# Patient Record
Sex: Female | Born: 1975
Health system: Southern US, Community
[De-identification: ages and names within clinical notes are randomized; demographics above are authoritative.]

## PROBLEM LIST (undated history)

## (undated) DIAGNOSIS — F419 Anxiety disorder, unspecified: Secondary | ICD-10-CM

## (undated) DIAGNOSIS — F32A Depression, unspecified: Secondary | ICD-10-CM

## (undated) DIAGNOSIS — R51 Headache: Secondary | ICD-10-CM

## (undated) DIAGNOSIS — I509 Heart failure, unspecified: Secondary | ICD-10-CM

## (undated) DIAGNOSIS — E039 Hypothyroidism, unspecified: Secondary | ICD-10-CM

## (undated) DIAGNOSIS — B009 Herpesviral infection, unspecified: Secondary | ICD-10-CM

## (undated) DIAGNOSIS — I471 Supraventricular tachycardia, unspecified: Secondary | ICD-10-CM

## (undated) DIAGNOSIS — R519 Headache, unspecified: Secondary | ICD-10-CM

## (undated) DIAGNOSIS — Z950 Presence of cardiac pacemaker: Secondary | ICD-10-CM

## (undated) DIAGNOSIS — F329 Major depressive disorder, single episode, unspecified: Secondary | ICD-10-CM

## (undated) HISTORY — DX: Supraventricular tachycardia, unspecified: I47.10

## (undated) HISTORY — DX: Presence of cardiac pacemaker: Z95.0

## (undated) HISTORY — PX: PACEMAKER INSERTION: SHX728

## (undated) HISTORY — PX: CARDIAC ELECTROPHYSIOLOGY MAPPING AND ABLATION: SHX1292

## (undated) HISTORY — DX: Herpesviral infection, unspecified: B00.9

## (undated) HISTORY — DX: Supraventricular tachycardia: I47.1

## (undated) HISTORY — PX: REDUCTION MAMMAPLASTY: SUR839

## (undated) HISTORY — DX: Hypothyroidism, unspecified: E03.9

---

## 1998-03-03 ENCOUNTER — Inpatient Hospital Stay (HOSPITAL_COMMUNITY): Admission: EM | Admit: 1998-03-03 | Discharge: 1998-03-04 | Payer: Self-pay | Admitting: Emergency Medicine

## 1998-05-02 ENCOUNTER — Emergency Department (HOSPITAL_COMMUNITY): Admission: EM | Admit: 1998-05-02 | Discharge: 1998-05-03 | Payer: Self-pay | Admitting: Emergency Medicine

## 1998-08-06 ENCOUNTER — Inpatient Hospital Stay (HOSPITAL_COMMUNITY): Admission: EM | Admit: 1998-08-06 | Discharge: 1998-08-07 | Payer: Self-pay | Admitting: *Deleted

## 1998-08-06 ENCOUNTER — Encounter: Payer: Self-pay | Admitting: Emergency Medicine

## 1999-02-16 ENCOUNTER — Other Ambulatory Visit: Admission: RE | Admit: 1999-02-16 | Discharge: 1999-02-16 | Payer: Self-pay | Admitting: *Deleted

## 1999-03-31 ENCOUNTER — Inpatient Hospital Stay (HOSPITAL_COMMUNITY): Admission: EM | Admit: 1999-03-31 | Discharge: 1999-04-01 | Payer: Self-pay | Admitting: Emergency Medicine

## 1999-03-31 ENCOUNTER — Encounter: Payer: Self-pay | Admitting: Emergency Medicine

## 2000-05-24 HISTORY — PX: BREAST SURGERY: SHX581

## 2000-06-02 ENCOUNTER — Ambulatory Visit (HOSPITAL_COMMUNITY): Admission: RE | Admit: 2000-06-02 | Discharge: 2000-06-03 | Payer: Self-pay | Admitting: Specialist

## 2000-07-10 ENCOUNTER — Emergency Department (HOSPITAL_COMMUNITY): Admission: EM | Admit: 2000-07-10 | Discharge: 2000-07-10 | Payer: Self-pay | Admitting: Emergency Medicine

## 2000-07-10 ENCOUNTER — Encounter: Payer: Self-pay | Admitting: Emergency Medicine

## 2000-10-04 ENCOUNTER — Other Ambulatory Visit: Admission: RE | Admit: 2000-10-04 | Discharge: 2000-10-04 | Payer: Self-pay | Admitting: Gynecology

## 2001-01-04 ENCOUNTER — Inpatient Hospital Stay (HOSPITAL_COMMUNITY): Admission: AD | Admit: 2001-01-04 | Discharge: 2001-01-04 | Payer: Self-pay | Admitting: Gynecology

## 2001-01-13 ENCOUNTER — Inpatient Hospital Stay (HOSPITAL_COMMUNITY): Admission: AD | Admit: 2001-01-13 | Discharge: 2001-01-13 | Payer: Self-pay | Admitting: Gynecology

## 2001-01-14 ENCOUNTER — Encounter: Payer: Self-pay | Admitting: Gynecology

## 2001-01-31 ENCOUNTER — Inpatient Hospital Stay (HOSPITAL_COMMUNITY): Admission: AD | Admit: 2001-01-31 | Discharge: 2001-01-31 | Payer: Self-pay | Admitting: Gynecology

## 2001-03-05 ENCOUNTER — Observation Stay (HOSPITAL_COMMUNITY): Admission: RE | Admit: 2001-03-05 | Discharge: 2001-03-06 | Payer: Self-pay | Admitting: Gynecology

## 2001-03-21 ENCOUNTER — Encounter: Payer: Self-pay | Admitting: Gynecology

## 2001-03-21 ENCOUNTER — Inpatient Hospital Stay (HOSPITAL_COMMUNITY): Admission: AD | Admit: 2001-03-21 | Discharge: 2001-03-23 | Payer: Self-pay | Admitting: Gynecology

## 2001-03-23 ENCOUNTER — Encounter: Payer: Self-pay | Admitting: Gynecology

## 2001-04-17 ENCOUNTER — Encounter (INDEPENDENT_AMBULATORY_CARE_PROVIDER_SITE_OTHER): Payer: Self-pay | Admitting: Specialist

## 2001-04-17 ENCOUNTER — Encounter: Payer: Self-pay | Admitting: Gynecology

## 2001-04-18 ENCOUNTER — Inpatient Hospital Stay (HOSPITAL_COMMUNITY): Admission: AD | Admit: 2001-04-18 | Discharge: 2001-05-28 | Payer: Self-pay | Admitting: Gynecology

## 2001-04-24 ENCOUNTER — Encounter: Payer: Self-pay | Admitting: Gynecology

## 2001-04-30 ENCOUNTER — Encounter: Payer: Self-pay | Admitting: Gynecology

## 2001-05-05 ENCOUNTER — Encounter: Payer: Self-pay | Admitting: Gynecology

## 2001-05-17 ENCOUNTER — Encounter: Payer: Self-pay | Admitting: Gynecology

## 2001-07-11 ENCOUNTER — Other Ambulatory Visit: Admission: RE | Admit: 2001-07-11 | Discharge: 2001-07-11 | Payer: Self-pay | Admitting: Gynecology

## 2001-10-19 ENCOUNTER — Other Ambulatory Visit: Admission: RE | Admit: 2001-10-19 | Discharge: 2001-10-19 | Payer: Self-pay | Admitting: Gynecology

## 2002-04-15 ENCOUNTER — Encounter: Payer: Self-pay | Admitting: Emergency Medicine

## 2002-04-15 ENCOUNTER — Emergency Department (HOSPITAL_COMMUNITY): Admission: EM | Admit: 2002-04-15 | Discharge: 2002-04-15 | Payer: Self-pay | Admitting: Emergency Medicine

## 2003-04-14 ENCOUNTER — Other Ambulatory Visit: Admission: RE | Admit: 2003-04-14 | Discharge: 2003-04-14 | Payer: Self-pay | Admitting: Gynecology

## 2003-04-29 ENCOUNTER — Encounter: Payer: Self-pay | Admitting: Emergency Medicine

## 2003-04-29 ENCOUNTER — Emergency Department (HOSPITAL_COMMUNITY): Admission: EM | Admit: 2003-04-29 | Discharge: 2003-04-30 | Payer: Self-pay | Admitting: Emergency Medicine

## 2003-07-08 ENCOUNTER — Inpatient Hospital Stay (HOSPITAL_COMMUNITY): Admission: AD | Admit: 2003-07-08 | Discharge: 2003-07-08 | Payer: Self-pay | Admitting: Gynecology

## 2003-07-11 ENCOUNTER — Inpatient Hospital Stay (HOSPITAL_COMMUNITY): Admission: RE | Admit: 2003-07-11 | Discharge: 2003-07-12 | Payer: Self-pay | Admitting: Gynecology

## 2003-10-17 ENCOUNTER — Emergency Department (HOSPITAL_COMMUNITY): Admission: EM | Admit: 2003-10-17 | Discharge: 2003-10-17 | Payer: Self-pay | Admitting: Emergency Medicine

## 2003-10-25 HISTORY — PX: TUBAL LIGATION: SHX77

## 2004-04-30 ENCOUNTER — Other Ambulatory Visit: Admission: RE | Admit: 2004-04-30 | Discharge: 2004-04-30 | Payer: Self-pay | Admitting: Gynecology

## 2004-08-19 ENCOUNTER — Encounter: Admission: RE | Admit: 2004-08-19 | Discharge: 2004-08-19 | Payer: Self-pay | Admitting: Orthopaedic Surgery

## 2004-08-22 ENCOUNTER — Emergency Department (HOSPITAL_COMMUNITY): Admission: EM | Admit: 2004-08-22 | Discharge: 2004-08-22 | Payer: Self-pay | Admitting: Emergency Medicine

## 2005-05-30 ENCOUNTER — Other Ambulatory Visit: Admission: RE | Admit: 2005-05-30 | Discharge: 2005-05-30 | Payer: Self-pay | Admitting: Gynecology

## 2006-11-27 ENCOUNTER — Emergency Department (HOSPITAL_COMMUNITY): Admission: EM | Admit: 2006-11-27 | Discharge: 2006-11-27 | Payer: Self-pay | Admitting: Emergency Medicine

## 2006-12-10 ENCOUNTER — Inpatient Hospital Stay (HOSPITAL_COMMUNITY): Admission: EM | Admit: 2006-12-10 | Discharge: 2006-12-12 | Payer: Self-pay | Admitting: Emergency Medicine

## 2006-12-10 ENCOUNTER — Encounter (INDEPENDENT_AMBULATORY_CARE_PROVIDER_SITE_OTHER): Payer: Self-pay | Admitting: Interventional Cardiology

## 2008-07-17 ENCOUNTER — Encounter: Payer: Self-pay | Admitting: Gynecology

## 2008-07-17 ENCOUNTER — Ambulatory Visit: Payer: Self-pay | Admitting: Gynecology

## 2008-07-17 ENCOUNTER — Other Ambulatory Visit: Admission: RE | Admit: 2008-07-17 | Discharge: 2008-07-17 | Payer: Self-pay | Admitting: Gynecology

## 2008-07-21 ENCOUNTER — Encounter: Admission: RE | Admit: 2008-07-21 | Discharge: 2008-07-21 | Payer: Self-pay | Admitting: Gynecology

## 2008-07-22 ENCOUNTER — Ambulatory Visit: Payer: Self-pay | Admitting: Gynecology

## 2009-07-02 ENCOUNTER — Other Ambulatory Visit: Admission: RE | Admit: 2009-07-02 | Discharge: 2009-07-02 | Payer: Self-pay | Admitting: Gynecology

## 2009-07-02 ENCOUNTER — Ambulatory Visit: Payer: Self-pay | Admitting: Gynecology

## 2009-07-02 ENCOUNTER — Encounter: Payer: Self-pay | Admitting: Gynecology

## 2009-10-20 ENCOUNTER — Ambulatory Visit: Payer: Self-pay | Admitting: Internal Medicine

## 2009-10-20 ENCOUNTER — Inpatient Hospital Stay (HOSPITAL_COMMUNITY): Admission: EM | Admit: 2009-10-20 | Discharge: 2009-10-22 | Payer: Self-pay | Admitting: Emergency Medicine

## 2009-10-21 ENCOUNTER — Encounter (INDEPENDENT_AMBULATORY_CARE_PROVIDER_SITE_OTHER): Payer: Self-pay | Admitting: Internal Medicine

## 2009-11-24 HISTORY — PX: CHOLECYSTECTOMY: SHX55

## 2009-12-07 ENCOUNTER — Encounter (INDEPENDENT_AMBULATORY_CARE_PROVIDER_SITE_OTHER): Payer: Self-pay | Admitting: Surgery

## 2009-12-07 ENCOUNTER — Observation Stay (HOSPITAL_COMMUNITY): Admission: RE | Admit: 2009-12-07 | Discharge: 2009-12-08 | Payer: Self-pay | Admitting: Surgery

## 2010-04-15 ENCOUNTER — Ambulatory Visit: Payer: Self-pay | Admitting: Gynecology

## 2010-06-29 ENCOUNTER — Emergency Department: Payer: Self-pay | Admitting: Emergency Medicine

## 2010-07-20 ENCOUNTER — Ambulatory Visit: Payer: Self-pay | Admitting: Gynecology

## 2010-07-20 ENCOUNTER — Other Ambulatory Visit: Admission: RE | Admit: 2010-07-20 | Discharge: 2010-07-20 | Payer: Self-pay | Admitting: Gynecology

## 2010-07-23 ENCOUNTER — Encounter: Admission: RE | Admit: 2010-07-23 | Discharge: 2010-07-23 | Payer: Self-pay | Admitting: Gynecology

## 2010-09-23 ENCOUNTER — Encounter: Admission: RE | Admit: 2010-09-23 | Discharge: 2010-09-23 | Payer: Self-pay | Admitting: Orthopedic Surgery

## 2011-01-12 LAB — COMPREHENSIVE METABOLIC PANEL
ALT: 11 U/L (ref 0–35)
Alkaline Phosphatase: 48 U/L (ref 39–117)
BUN: 6 mg/dL (ref 6–23)
CO2: 29 mEq/L (ref 19–32)
GFR calc non Af Amer: >60 mL/min (ref >60)
Glucose, Bld: 99 mg/dL (ref 70–99)
Potassium: 4.5 mEq/L (ref 3.5–5.1)
Sodium: 140 mEq/L (ref 135–145)
Total Bilirubin: 0.6 mg/dL (ref 0.3–1.2)
Total Protein: 6.8 g/dL (ref 6.0–8.3)

## 2011-01-12 LAB — DIFFERENTIAL
Basophils Absolute: 0.1 10*3/uL (ref 0.0–0.1)
Basophils Relative: 1 % (ref 0–1)
Eosinophils Absolute: 0.1 10*3/uL (ref 0.0–0.7)
Neutro Abs: 6 10*3/uL (ref 1.7–7.7)
Neutrophils Relative %: 65 % (ref 43–77)

## 2011-01-12 LAB — CBC
MCHC: 35.5 g/dL (ref 30.0–36.0)
MCV: 83.7 fL (ref 78.0–100.0)
RDW: 14 % (ref 11.5–15.5)
WBC: 9.3 10*3/uL (ref 4.0–10.5)

## 2011-01-24 LAB — CBC
HCT: 35.7 % — ABNORMAL LOW (ref 36.0–46.0)
Hemoglobin: 12.6 g/dL (ref 12.0–15.0)
Hemoglobin: 12.9 g/dL (ref 12.0–15.0)
MCHC: 34.8 g/dL (ref 30.0–36.0)
MCV: 84.5 fL (ref 78.0–100.0)
RBC: 4.26 MIL/uL (ref 3.87–5.11)
RBC: 4.38 MIL/uL (ref 3.87–5.11)
RDW: 13 % (ref 11.5–15.5)
WBC: 9 10*3/uL (ref 4.0–10.5)

## 2011-01-24 LAB — LIPID PANEL
LDL Cholesterol: 93 mg/dL (ref 0–99)
Triglycerides: 136 mg/dL (ref <150)

## 2011-01-24 LAB — COMPREHENSIVE METABOLIC PANEL
CO2: 27 mEq/L (ref 19–32)
Calcium: 8.4 mg/dL (ref 8.4–10.5)
Creatinine, Ser: 0.81 mg/dL (ref 0.4–1.2)
GFR calc non Af Amer: >60 mL/min (ref >60)
Glucose, Bld: 98 mg/dL (ref 70–99)

## 2011-01-24 LAB — DIFFERENTIAL
Basophils Absolute: 0.1 10*3/uL (ref 0.0–0.1)
Eosinophils Relative: 1 % (ref 0–5)
Lymphocytes Relative: 25 % (ref 12–46)
Neutrophils Relative %: 64 % (ref 43–77)

## 2011-01-24 LAB — T4, FREE: Free T4: 1.2 ng/dL (ref 0.80–1.80)

## 2011-01-24 LAB — BASIC METABOLIC PANEL
Calcium: 8.5 mg/dL (ref 8.4–10.5)
GFR calc Af Amer: >60 mL/min (ref >60)
GFR calc non Af Amer: >60 mL/min (ref >60)
Glucose, Bld: 92 mg/dL (ref 70–99)
Potassium: 3.8 mEq/L (ref 3.5–5.1)
Sodium: 141 mEq/L (ref 135–145)

## 2011-01-24 LAB — CARDIAC PANEL(CRET KIN+CKTOT+MB+TROPI)
CK, MB: 0.9 ng/mL (ref 0.3–4.0)
CK, MB: 1 ng/mL (ref 0.3–4.0)
Relative Index: INVALID (ref 0.0–2.5)
Troponin I: 0.01 ng/mL (ref 0.00–0.06)
Troponin I: <0.01 ng/mL (ref 0.00–0.06)

## 2011-01-24 LAB — MAGNESIUM: Magnesium: 2.3 mg/dL (ref 1.5–2.5)

## 2011-01-24 LAB — CK TOTAL AND CKMB (NOT AT ARMC)
CK, MB: 0.9 ng/mL (ref 0.3–4.0)
Relative Index: INVALID (ref 0.0–2.5)
Total CK: 62 U/L (ref 7–177)

## 2011-01-24 LAB — HEMOGLOBIN A1C
Hgb A1c MFr Bld: 5.6 % (ref 4.6–6.1)
Mean Plasma Glucose: 114 mg/dL

## 2011-01-24 LAB — SEDIMENTATION RATE: Sed Rate: 3 mm/hr (ref 0–22)

## 2011-01-24 LAB — PROTIME-INR
INR: 1.16 (ref 0.00–1.49)
Prothrombin Time: 14.7 seconds (ref 11.6–15.2)

## 2011-01-24 LAB — BRAIN NATRIURETIC PEPTIDE: Pro B Natriuretic peptide (BNP): 65 pg/mL (ref 0.0–100.0)

## 2011-01-24 LAB — TROPONIN I: Troponin I: 0.03 ng/mL (ref 0.00–0.06)

## 2011-03-11 NOTE — Discharge Summary (Signed)
[facility]  Patient:    [name], [name]                    MRN: [id number] Adm. Date:  [id number] Disc. Date: [id number] Attending:  Tonye Royalty Dictator:   Antony Contras, [facility] [place]                           Discharge Summary  DISCHARGE DIAGNOSES:          1. Intrauterine pregnancy at 32 weeks.                               2. Incompetent cervix.                               3. Footling breech presentation.                               4. Labor.  PROCEDURE:                    Primary low cervical transverse cesarean section                               with cerclage removal.  HISTORY OF PRESENT ILLNESS:   The patient is a 35 year old, gravida 4, para 0-0-3-0 with an LMP of October 12, 2000, [facility] of July 09, 2001. Prenatal risk factors include a history of SVT with a pacemaker. The patient also has an incompetent cervix for which cerclage was placed.  PRENATAL LABORATORY DATA:     Blood type A negative, antibody screen negative. RPR, HBsAg, HIV nonreactive. Rubella immune. MSAFP normal.  HOSPITAL COURSE/TREATMENT:    The patient was admitted on April 17, 2001 secondary to lower abdominal pressure and contractions. Her cerclage had been placed at [redacted] weeks gestation. She also has a history of supraventricular tachycardia and has a pacemaker. She is also followed by cardiologists in [facility] in [place] and also in [place]. She was maintained expectantly in the hospital due to her intermittent contraction pattern and that fact that her cervix was very short. She did remain stable until May 24, 2001 at which time she developed uterine contractions and pelvic pressure, and due to her history or pulmonary edema previously on magnesium sulfate, it was felt that at the point it was prudent to proceed with cesarean section. Low cervical transverse cesarean section with cerclage removal was performed by Dr. Audie Box under  spinal anesthesia. The patient was delivered of a female infant. Apgars were 4, 3, and 7. Weight was 3 pounds 11 ounces. Pelvic anatomy was normal.  Postoperatively, the patient did experience some itching. She was given RhoGAM.  LABORATORIES:                 CBC: Hematocrit was 31.8, hemoglobin 11.1, WBCs 11.2, platelets 229,000.  She was discharged on her fourth postoperative day in satisfactory condition.  DISPOSITION:                  The patient is to follow up in the office in six weeks.  DISCHARGE MEDICATIONS:        She is to continue with her prenatal vitamins and iron. She  was given Demerol 50 mg, #20, for pain. DD:  06/08/01 TD:  06/09/01 Job: [id number]

## 2011-03-11 NOTE — Op Note (Signed)
[facility]  Patient:    [name], [name]                    MRN: [id number] Proc. Date: 05/24/01 Adm. Date:  [id number] Attending:  Tonye Royalty                           Operative Report  PREOPERATIVE DIAGNOSES:       1. Pregnancy at 32 weeks.                               2. Incompetent cervix.                               3. Footling breach presentation.                               4. Labor.  POSTOPERATIVE DIAGNOSES:      1. Pregnancy at 32 weeks.                               2. Incompetent cervix.                               3. Footling breach presentation.                               4. Labor.  OPERATION:                    1. Primary low transverse cervical cesarean                                  section.                               2. Cerclage removal.  SURGEON:                      Timothy P. Fontaine, M.D.  ASSISTANT:                    Scrub technician.  ANESTHESIA:                   Regional.  ESTIMATED BLOOD LOSS:         Less than 500 cc.  COMPLICATIONS:                None.  SPECIMENS:                    1) Samples of cord blood, 2) placenta and umbilical cord.  FINDINGS:                     Normal female infant, Apgars not give.  Pelvic anatomy noted to be normal.  Cerclage double stranded silk suture removed and shown to the patient and subsequently discarded.  PROCEDURE IN DETAIL:          The patient was taken to the operating room, underwent  regional anesthesia, placed in left tilt supine position, received an abdominal preparation with Betadine scrub and Betadine solution.  The bladder was emptied with indwelling Foley catheter placed in sterile technique.  The patient was draped in the usual fashion.  After assuring adequate anesthesia, the abdomen was sharply entered through a low Pfannenstiel incision, achieving adequate hemostasis at all levels.  The bladder flap was sharply and bluntly  developed without difficulty.  The uterus was sharply entered in the lower uterine segment and bluntly extended laterally.  The bulging membranes were ruptured.  The fluid was noted to be clear.  The infant found in the footling breach presentation and subsequently underwent a breach extraction without difficulty.  The nares and mouth were suction, the cord doubly clamped and cut, and the infant was handed to pediatrics in attendance.  Samples of cord blood were obtained and the placenta was extruded spontaneously and noted to be intact.  The uterus was exteriorized, and the endometrial cavity was explored with a sponge to remove all placental membrane fragments.  The patient received 1 g Cefotan IV prophylaxis.  The uterine incision was closed in two layers using 0 Vicryl sutures, first in a running interlocking stitch followed by an imbricating stitch.  THe incision required two subsequent figure-of-eight interrupted sutures for ultimate hemostasis.  The uterus was then returned to the abdomen which was copiously irrigated showing adequate hemostasis.  The anterior fascia was then reapproximated using 0 Vicryl suture in a running stitch. Subcutaneous tissues irrigated, hemostasis achieved with electrocautery, and subsequently the incision was closed using staples, and a sterile dressing was applied.  The patient was then placed in the low dorsolithotomy position.  The cervix visualized, the knot of the cerclage grasped under gently traction, and the cerclage was cut and removed in its entirety.  The cerclage was shown to the patient and then subsequently discarded.  The patient was then placed in supine position and taken to the recovery room in good condition having tolerated the procedure well.DD:  05/24/01 TD:  05/24/01 Job: [id number]

## 2011-03-11 NOTE — Discharge Summary (Signed)
   NAME:  [name], [name]                      ACCOUNT NO.:  [id number]   MEDICAL RECORD NO.:  [id number]                   PATIENT TYPE:  INP   LOCATION:  9372                                 FACILITY:  [facility]   PHYSICIAN:  Timothy P. Fontaine, M.D.           DATE OF BIRTH:  October 15, 1976   DATE OF ADMISSION:  07/11/2003  DATE OF DISCHARGE:  07/12/2003                                 DISCHARGE SUMMARY   DISCHARGE DIAGNOSES:  1. Intrauterine pregnancy 15-1/2 weeks, undelivered.  2. History of incompetent cervix status post McDonald cerclage placement by     Gaetano Hawthorne. Lily Peer, M.D. on July 11, 2003, pacemaker in place.   HISTORY:  This is a 35 year old female gravida 5, para 1, aborta 3 whose  prenatal course had been complicated by a pacemaker for supraventricular  tachycardia followed by cardiologist [facility].  She had a known  incompetent cervix.  She was Rh negative.  The patient suffered from  depression, but had been tapered off her medication prior to pregnancy.  Had  a history of recurrent pregnancy losses and the work-up was within normal  limits.  She did have a history of chronic bacterial vaginosis which was  treated.  The patient had a history of a prior cesarean section.  The  patient does require repeat cesarean section with attempt at permanent  sterilization.   HOSPITAL COURSE:  On July 11, 2003 patient was admitted at 15-1/2 weeks  for cerclage placement and underwent a placement of a McDonald cerclage by  Gaetano Hawthorne. Lily Peer, M.D.  On July 13, 2003 patient was afebrile and  vital signs seemed to be stable.  There was no bleeding, cramping, or  complaint.  Blood pressure had been noted to be slightly on the low side,  but was felt secondary to patient's resting, anesthesia given.  O2  saturations remained good.  Asymptomatic and ambulating without difficulty.  The patient was felt stable for discharge home today.  She was to follow up  in the office  that week.  Precautions given with the patient.  She was given  RhoGAM prior to discharge.     Susa Loffler, P.A.                    Timothy P. Audie Box, M.D.    Ardath Sax  D:  08/01/2003  T:  08/01/2003  Job:  [id number]

## 2011-03-11 NOTE — Consult Note (Signed)
NAME:  SHANTRICE, RODENBERG                      ACCOUNT NO.:  [id number]   MEDICAL RECORD NO.:  [id number]                   PATIENT TYPE:  EMS   LOCATION:  MAJO                                 FACILITY:  [facility]   PHYSICIAN:  Francisca December, M.D.               DATE OF BIRTH:  09/15/1976   DATE OF CONSULTATION:  10/17/2003  DATE OF DISCHARGE:                                   CONSULTATION   REASON FOR CONSULTATION:  Tachycardia.   HISTORY OF PRESENT ILLNESS:  [name] is a pleasant 35 year old  woman who awoke this morning with the sensation of tachy palpitation and  dyspnea, as well as some mild chest tightness.  There were no relieving  factors and she did not experience presyncope or syncope.  She finally came  to the emergency room at approximately 1630, where an ECG found her to be in  rapid heart rate, uncertain rhythm, but ventricular pacing at 140 beats per  minute.   Lekeya has a long history of tachyarrhythmia.  She initially developed SVT  at the age of 35.  She was treated medically without much success.  She  finally underwent a series of attempted ablations which was unsuccessful,  culminating in an AV node ablation and permanent pacemaker insertion at the  age of 35.  She had a pacemaker placed in the left lower chest with  tunneling of the leads into the subclavian vein in 1997.  This apparently  developed some complication, perhaps a wound complication, it is not clear.  It was eventually removed, and the pacemaker placed in the upper abdominal  position in 1998.  This is a Medtronic Kappa model 701.   Since that time, she has not had any further episodes of SVT.  Has been  completely asymptomatic, and is taking no medications.   PAST MEDICAL HISTORY:  As noted above, otherwise benign.   CURRENT MEDICATIONS:  None.   FAMILY HISTORY:  Noncontributory.   ALLERGIES:  PERCOCET causes hives.   REVIEW OF SYSTEMS:  She denies any recent fever or chills.  No  cough or  sputum production.  No ankle swelling or calf/leg pain.  No black or bloody  stools.  No heavy periods.  She has no history of diabetes or  hypothyroidism.  No chronic headache, dizziness, or lightheadedness.  No  blurred vision or sore throat.  No abdominal pain, diarrhea.  She is 30  weeks pregnancy uterine not delivered at this point.  She has no problems  with urination or psychiatric complaints.   PHYSICAL EXAMINATION:  VITAL SIGNS:  Blood pressure is 123/40, pulse is 140  and regular, temperature is 99.8, respirations are 20, O2 saturation on room  air is 100%.  GENERAL:  This is a pleasant 35 year old Caucasian woman who is in the third  trimester of pregnancy.  HEENT:  Unremarkable.  Head is normocephalic, atraumatic.  Pupils equal,  round, reactive to  light and accommodation.  Extraocular movements were  intact.  Oral mucosa is pink and moist.  The tongue is not coated.  The  teeth and gums are in good repair.  NECK:  Supple without thyromegaly or masses.  The carotid upstrokes are  normal.  There is no bruit.  There is no jugular venous distention.  CHEST:  Clear with adequate excursion.  Normal vesicular breath sounds  throughout.  HEART:  Tachycardic rhythm, normal S1 and S2, no murmur, click, or rub, no  gallop.  ABDOMEN:  Gravid.  The uterine fundi is easily palpable.  Bowel sounds  present throughout.  PELVIC:  External genitalia not examined.  RECTAL:  Not performed.  EXTREMITIES:  Full range of motion, trace edema, and intact distal pulses.  NEUROLOGIC:  Cranial nerves II-XII intact.  Motor and sensory grossly  intact.  Gait not tested.  SKIN:  Warm, dry, and clear.   LABORATORY DATA:  Hemoglobin is 8.8, hematocrit 26.  Electrolytes, BUN, and  creatinine are all normal.  An electrocardiogram initially shows ventricular  pacing at a rate of 140.  The pacemaker is interrogated.  It was found to  have an upper rate limit of 150, a lower rate limit of 70.  It  was in the  DDDR mode.  Atrial electrogrounds reveal the presence of atrial flutter with  a cycle length of 200 milliseconds.  The pacemaker was attempting to track  it at a septal rate limit.   The patient was administered 10 mg of metoprolol.  This did cause her blood  pressure to fall into the 92/50 range.  The pacemaker was used in an attempt  to rapid atrial pace.  This was unsuccessful.  It was finally reprogrammed  to DDDR mode switch on, atrial rate of 160.  This resulted in a ventricular  pacing at a rate of currently 75 beats per minute, which is its lower rate  limit.  Upper rate limit is set at 110.   FINAL IMPRESSION:  1. Significant ongoing anemia.  2. New onset atrial flutter, borderline fibrillation, with subsequent     attempting tracking by dual chamber pacemaker, rate of 140 to 150.  3. Reprogrammed to DDDR mode switch on, current lower rate limit 75, upper     rate limit 110.  4. Status post AV node ablation, pacemaker dependent.  5. Pregnancy, uterine, not delivered at 30 weeks.   PLAN:  1. The patient is to be discharged as she can ambulate comfortably and not     significantly lightheaded now that she is essentially in the DVI mode.  2. She is advised that she is likely to feel less energetic and some sense     of dyspnea frequently.  3. She has followup with her cardiologist at [facility] in three days, encouraged     to keep that appointment.  4. Much reassurance given that her tachycardia will not recur.  5. Systemic anticoagulation is contraindicated.  6. Treatment for her atrial fibrillation/flutter will have to be delayed     until after her delivery.  I see no need at this point to proceed to an     unexpected delivery.                                               Francisca December, M.D.  JHE/MEDQ  D:  10/17/2003  T:  10/17/2003  Job:  [id number]   cc:   Vesta Mixer, M.D. [address], [address]  [place]  [place] [address]  Fax: [phone]    Oren Binet, M.D.  [facility]

## 2011-03-11 NOTE — Op Note (Signed)
[facility]  Patient:    [name], [name]                    MRN: [id number] Proc. Date: 03/05/01 Adm. Date:  [id number] Attending:  Tonye Royalty                           Operative Report  INDICATIONS:                  A 35 year old gravida 4, para 0, Ab 3, with clinical evidence of cervical incompetence based on ultrasound, that had been followed in the office during her first trimester. The patient with three previous D&Cs for first trimester miscarriages.  PREOPERATIVE DIAGNOSIS:       Cervical incompetence, 19-1/2 weeks estimated                               gestational age.  POSTOPERATIVE DIAGNOSIS:      Cervical incompetence, 19-1/2 weeks estimated                               gestational age.  PROCEDURE:                    Rescue cerclage/McDonald procedure.  SURGEON:                      Juan H. Lily Peer, M.D.  ANESTHESIA:                   Spinal.  ESTIMATED BLOOD LOSS:         None.  DESCRIPTION OF PROCEDURE:     After the patient was adequately counseled, she was taken to the operating room where prior to that she had received 2 grams of Cefotan for IV antibiotic prophylaxis. Once in the operating room, after the spinal was in place, she was placed in low lithotomy position. The vagina and perineum were prepped and draped in the usual sterile fashion. The patient had a Foley catheter to keep the bladder empty during the procedure as well as postoperatively for 24 hours while the patient is on bed rest. With a short-weighted bill speculum placed in the posterior vaginal vault and two sidewall retractors, the cervix was brought into view. The vaginal vault was cleansed with a clindamycin douche consisting of 150 mg of clindamycin in 30 cc of saline. Once this was completed, an Allis clamp was used to grasp the posterior lip of the cervix and a McDonald cerclage was placed in the following fashion: We used a #4 size  double-stranded silk suture which entered at the 5 oclock position and exited at about the 3:30 position, then reentered the cervicovaginal mucosa area at the 2 oclock area and exited at 12:30 position, and reentered the cervicovaginal mucosa at the 11:30 position and exiting around the 9:30 to 10 oclock position and reentering again at the cervicovaginal mucosa at the 8 oclock position and exiting at the 6 oclock position. Since this was a double-stranded silk suture, each was independently secured with the assistants finger placed in the cervical os to assure that there was some patency in the cervix. Both sutures were secured, the knot was left in the posterior aspect at the 6 oclock position for removal at a later date. The patient  tolerated the procedure well. Due to the fact that she is Rh negative, she will receive 300 mcg of RhoGAM in the operating room. She has a pacemaker in place due to past history of supraventricular tachycardia, did well intraoperatively without any cardiac arrhythmias, blood pressure and heart rate were normal. She will be kept in the hospital overnight for monitoring to rule out contractions. She will be placed on Motrin 400 mg p.o. q.4h. for six doses. Blood loss from the procedure was none, fluid resuscitation consisted of 1700 cc of lactated Ringers, and Foley catheter had 400 cc of urine. DD:  03/05/01 TD:  03/05/01 Job: [id number]

## 2011-03-11 NOTE — Op Note (Signed)
[place]  Patient:    [name], [name]                      MRN: [id number] Proc. Date: 06/02/00 Adm. Date:  [id number] Attending:  Gustavus Messing                           Operative Report  INDICATIONS:  This is a 35 year old lady who has severe, severe macromastia, back and shoulder pain secondary to large, pendulous breasts, as well as increase in excessive breast tissue over the axillary and latissimus dorsi area.  PROCEDURES PLANNED:  Bilateral breast reduction and excision of accessory breast tissue.  SURGEON:  Yaakov Guthrie. Shon Hough, M.D.  ANESTHESIA:  General.  DESCRIPTION OF PROCEDURE:  Preoperatively, the patient was sat up and drawn for the reduction mammoplasty using the inferior pedicle technique, remarking the nipple-areolar complex to 20 cm from the suprasternal notch.  She then underwent general anesthesia, intubated orally.  After this, with the patients history of permanent pacemaker, a magnet was placed over her pacemaker to desensitize any interference from the Bovie and was paced at about 84.  Prep was done to the breast and chest areas in a routine fashion using Betadine soap solution, walled off with sterile towels and drapes so as to make a sterile field.  The breasts were injected with 0.25% Xylocaine with epinephrine 1:400,000 concentration, a total of 100 cc per side.  The wounds were scored with a #15 blade.  The skin over the inferior pedicle was de-epithelialized using a #20 blade.  The medial and lateral fatty dermal pedicles were excised down to underlying fascia.  Hemostasis was maintained with the Bovie unit coagulation.  After this, the new keyhole area was also debulked.  After proper hemostasis, laterally more breast tissue was removed, accessory breast tissue along the latissimus dorsi and axillary regions and excess.  Irrigation was done after proper hemostasis.  The flaps were then transposed and  stayed with 3-0 Prolene.  Subcutaneous closure was done with 3-0 Monocryl x 2 layers and a running subcuticular stitch with 3-0 Monocryl and 5-0 Monocryl throughout the inverted T.  The same procedure was carried out on both sides, and the wounds were drained with #10 Blake drains, which were placed in the depths of the wounds and brought out through the lateralmost portion of the incisions and secured with 3-0 Prolene.  The wounds were cleansed, half-inch Steri-Strips and soft dressings were applied on all the areas, including Xeroform, 4 x 4s, ABD, Hypafix tape.  She withstood the procedure very well.  She was taken to recovery in excellent condition. Estimated blood loss 100 cc.  Complications:  None.  She had excellent blood supply to her nipple-areolar complexes postoperatively. DD:  06/02/00 TD:  06/02/00 Job: [id number]

## 2011-03-11 NOTE — H&P (Signed)
[name], [name]                         ACCOUNT NO.:  [id number]   MEDICAL RECORD NO.:  [id number]                   PATIENT TYPE:   LOCATION:                                       FACILITY:   PHYSICIAN:  Juan H. Lily Peer, M.D.             DATE OF BIRTH:   DATE OF ADMISSION:  DATE OF DISCHARGE:                                HISTORY & PHYSICAL   SCHEDULED FOR SURGERY:  July 11, 2003 at [facility] at 12:30 p.m.   CHIEF COMPLAINT:  History of incompetent cervix.   HISTORY:  The patient is a 35 year old gravida 5, para 1, AB 3 currently 70-  1/2 weeks estimated gestational age  The patient was seen in the office on  August 6 for first OB appointment and several risk factors were identified  and will be discussed as follows:  1. Patient with history of Wolff-Parkinson-White syndrome (pacemaker for     supraventricular tachycardia) which had been followed by the cardiologist     at [facility].  She does phone telemetry monthly for monitoring of     her pacemaker.  The patient with periodic follow ups with them as well.     Her scheduled appointment was supposed to be in September -- her next     visit.  2. The patient had been seen for her annual gynecological on April 14, 2003     (her last menstrual period had been reported to be Mar 23, 2003) and had     been complaining of headaches.  She was sent for a CT scan because of an     MRI which was not able to be performed because of her pacemaker.  The CT     had been reported to be normal.  She was also having positional changes     contributing to dizziness and that was the reason for the CT scan.  She     also had a TSH with random blood sugar which was normal.  At that time     she stated that she wanted to have a serum pregnancy test as she stated     that on day 14 of her last cycle a condom had broken and I had seen her     prior to that 7 days from the episode; when I saw her it had only been 7     days  since the episode occurred.  Her serum pregnancy test was negative     at that time and her blood sugar, TSH, prolactin, and CBC were normal.  3. The patient has suffered from depression and had been on Lexapro and has     tapered off.  4. The patient with past history of cervical LEEP conization for dysplasia.     As a result she had what appeared to be cervical incompetence via     cesarean section at 32 weeks  as a result of a footling breech.     Ultrasound on August 5 at 2004 showed a cervical length measurement of     3.2 cm and closed with a viable intrauterine pregnancy coinciding with     her last menstrual period with an estimated date of confinement of     between March 6 and March 10 of 2005.  The ultrasound, today in the     office, demonstrated a long and closed cervix 4.5 cm with good cardiac     activity and amniotic fluid was normal.  5. Recurrent pregnancy losses.  Review of her records indicated that she had     been screened for antiphospholipid antibodies and lupus anticoagulant all     of which were negative.  She had a normal karyotype as well as her     previous partner.  This is a different partner with this pregnancy.  She     has had a prior normal sonohysterogram in December 2000 and she had a     negative toxoplasmosis screen in the past.  The patient had been on     progesterone suppository twice a day intravaginally for __________     support during this first trimester.  6. The patient is a RhoGAM candidate to be administered at [redacted] week     gestation.  7. The patient with history of chronic bacterial vaginosis.  The patient was     treated during that first prenatal visit with clindamycin 300 mg b.i.d.     for a 7-day course secondary to positive BV.  Her GC and Chlamydia     culture had been done and was negative as also was her previous culture.  8. The patient had complained of recent exposure to parvovirus B19 and she     had slightly elevated IgG and  IgM was 0.5, convalescent titer was     repeated and was normal.  It represents perhaps old exposure, benign for     disease.  9. The patient recently treated for urinary tract infection.  10.      The patient's mother, second child, was born mentally retarded for     fragile X testing was done and was negative.  She also had cystic     fibrosis testing, as well, which was negative.  11.      The patient requesting repeat cesarean section with tubal     sterilization at the time of her cesarean section.  The patient was asked     to come to the office today.  She was placed on Cleocin vaginal cream     approximately 5 days prior to her surgery because of recurrent BV and was     asked to come today for a wet prep which demonstrated just small amounts     of yeast, only, noted.   ALLERGIES:  She is allergic to PERCOCET.   PAST MEDICAL HISTORY:  1. History of cervical incompetence.  2. Pacemaker (supraventricular tachycardia/Wolff-Parkinson-White).  3. Previous cesarean section secondary to footling breech at [redacted] weeks     gestation.  4. RhoGAM candidate.  5. History of chronic BV.  6. IUDR last pregnancy.  7. Recurrent pregnancy losses negative workup.  8. Request for tubal sterilization following cesarean section.  9. Patient with several D&Cs in the past.  Also had cervical polyps removed     from her cervix.   PHYSICAL EXAMINATION:  VITAL SIGNS:  Blood pressure 108/70.  Urine trace  protein, negative sugar.  Weight 170 pounds.  HEENT:  Unremarkable.  NECK:  Supple. Trachea in midline. No carotid bruits.  No thyromegaly.  LUNGS:  Clear to auscultation without rhonchi or wheezes.  HEART:  Regular rate and rhythm.  No murmurs or gallops.  ABDOMEN:  Soft, nontender.  Positive fetal heart tone.  PELVIC: Cervix long and closed and posterior.  RECTAL: Exam not done.  EXTREMITIES:  DTR 1+, negative clonus, no edema.  PRENATAL LABS:  A negative blood type, negative antibody screen.   VDRL was  nonreactive.  Rubella immune.  Hepatitis B surface antigen and HIV were  negative.   ASSESSMENT:  A 35 year old gravida 5, para 1, AB 3 currently 15-1/2 weeks  estimated gestational age.  History of cervical incompetence secondary to  previous cervical LEEP procedure.  The patient with premature delivery at 32  weeks footling breech and advanced cervical dilatation.  The patient was  counseled as to the risks, benefits, pros-and-cons of vaginal placement of  cerclage.  The risks to include: Infection, bleeding, rupture of membranes,  labor, miscarriage, or fetal death.  All of these issues were discussed.  The benefits outweigh the risks because of the history of cervical  incompetence.  The patient will received prophylactic antibiotics.  She will  be kept in the hospital 24 hours for observation and will also make sure  that when she returns to the office the following week that we obtain the  maternal serum alpha-fetoprotein which will be due at that time.  All  questions were answered and we will follow accordingly.    PLAN:  The patient is scheduled to undergo transvaginal McDonald cerclage  placement on Friday, September 17 at 1 p.m. at [facility].                                               Seligman H. Lily Peer, M.D.    JHF/MEDQ  D:  07/10/2003  T:  07/11/2003  Job:  [id number]

## 2011-03-11 NOTE — H&P (Signed)
[facility]  Patient:    [name], [name]                      MRN: [id number] Adm. Date:  03/02/01 Attending:  Gaetano Hawthorne. Lily Peer, M.D.                         History and Physical  CHIEF COMPLAINT:              Cervical incompetence.  HISTORY OF PRESENT ILLNESS:   The patient is a 35 year old gravida 4, para 0, AB 3, with last menstrual period October 12, 2000.  Estimated date of confinement July 19, 2001.  Currently, 19-5/[redacted] weeks gestation.  The patient was seen in the office on a routine OB appointment and an ultrasound had been done due to the fact that she was having pain in the left adnexal area and also for anatomical surveillance.  The ultrasound concurred with the early ultrasound, but it was interesting to find that the cervical length was only 1.8 cm and the cervix was dilated and only an area through its course of 1.8 cm.  The dilated length was recorded as 1.8 cm and AP diameter was 2.0 cm dilated.  The adnexa was normal.  She had a fundal placenta with adequate amniotic fluid and the fetus was in the vertex presentation.  She was asked to return and she did on Feb 27, 2001.  There is essentially no change in her transvaginal ultrasound and a wet prep demonstrated evidence of bacterial vaginosis.  She was to be placed on Flagyl 500 mg two tablets initially followed by one tablet q.12, and also the morning of her cerclage placement, as well as for one week afterwards.                                As part of her past medical history, the patient with known history of supraventricular tachycardia with a pacemaker.  Has been followed by cardiologist at [facility] in [facility].  A letter from Dr. Lindwood Coke, associate professor of surgery and Division of Cardiothoracic Surgery at [facility] had been seeing the patient.  It stated that the patient had a Wolff-Parkinson-White syndrome as the reason for her pacemaker and she did not  require any form of anticoagulation.  Of note, review of her medical records indicated when she had an ultrasound at 13 weeks, her cervical was measured and was 3.4 cm, and a significant change in a six week period with evidence of cervical incompetence.  This pregnancy was the result of a ________ ovulation induction.  The patient with a history of recurrent pregnancy losses and possible progesterone insufficiency.  Had been placed on progesterone suppositories during her first trimester. Evaluation for recurrent pregnancy losses were negative and she had been tested for lupus anticoagulant and antiphospholipid antibody screen were all negative.  Her and her husband also had normal karyotype XII.  The patient prior to this episode was treated for bacterial vaginosis at [facility] on February 01, 2001.  On February 12, 2001, she requested STD evaluation due to the fact that her husband had been unfaithful to her and she was tested.  RPR, hepatitis B surface antigen, and HIV were all negative, and there was no evidence of Trichomoniasis at that time.  The patient had denied any contractions or any unusual mucousy  discharge or bleeding recently, and this was an incidental finding at the time of screening ultrasound.  PAST MEDICAL HISTORY:         1. Supraventricular tachycardia (with                                  pacemaker).                               2. RhoGAM candidate.  The patient with three                                  spontaneous ABs in 1996, 1999, and the year                                  2000.                               3. She has had a history of cervical dysplasia                                  in the past and had cervical polyps removed                                  in 1996.  ALLERGIES:                    PERCOCET.  FAMILY HISTORY:               Brother with history of bipolar disorder and alcohol abuse.  She had a pacemaker placed in 1997 and has had  seven additional heart surgeries.  She had a breast reduction and liposuction in 1995.  She had pneumonia as a child.  REVIEW OF SYSTEMS:            ________.  PHYSICAL EXAMINATION:  GENERAL:                      A well-developed and well-nourished female.  HEENT:                        Unremarkable.  NECK:                         Supple, trachea midline.  No carotid bruits.  No thyromegaly.  LUNGS:                        Clear to auscultation without rhonchi or wheezes.  HEART:                        Pacemaker evident.  Regular rate and rhythm.  No murmurs or gallops.  ABDOMEN:                      Gravid uterus.  Fundal height approximately 20 cm.  Positive fetal heart sounds.  PELVIC EXAMINATION:  Cervix short and with a description as noted above on ultrasound findings.  Closed external os.  EXTREMITIES:                  DTRs 1+, negative clonus.  PRENATAL LABORATORY DATA:     A-negative blood type with negative antibody screen.  VDRL, hepatitis B surface antigen, and HIV were negative.  Rubella with evidence of immunity.  Pap smear was normal.  Alpha fetoprotein was normal.  ASSESSMENT:                   A 35 year old gravida 4, para 0, AB 3, at 19-1/2 weeks estimated gestational age with evidence of cervical incompetence, treated for bacterial vaginosis in April of this year, and  today, she was found to have two days prior to her surgery on follow up that she had bacterial vaginosis.  She will be placed on Flagyl 500 mg to take two tablets p.o. initially followed by one every 12 hours until the time of her surgery, and will keep her on it for one week afterwards.  She will stay in the hospital overnight. Will give her Motrin 800 mg t.i.d.  Will off on any tocolytics unless there is any evidence of increased uterine activity.  Also, she will receive 2 g of Cefotan for prophylaxis, and will have an ultrasound available in the room when we place her in the  Trendelenburg position at the time of placement of McDonald cerclage.  Also due to the fact that she is A-negative blood tyupe,  will give her RhoGAM 300 mcg IM.  The procedure was discussed in detail with the patient who is fully aware of the increased risks such as rupturing of the membranes, premature labor, premature delivery.  Also in the event that later on once we have discharged her home, she should be at strict bed rest.  If she experiences a vaginal infection or contractions or thinning of the lower uterine segment or ballooning effect, that it would be an indication to remove the cerclage.  If not, we will keep the cerclage in place to [redacted] weeks gestation.  The patient is aware of the above mentioned risks, above potential complications such as amniotic fluid embolism, death, or complication from anesthesia.  If she were to deliver prematurely, the baby would not survive at this point in gestation.  All of these issues were discussed at length with the patient, full aware, and feels comfortable with proceeding with above recommended McDonald cerclage.  PLAN:                         As per assessment above. DD:  02/28/01 TD:  02/28/01 Job: [id number]

## 2011-03-11 NOTE — Op Note (Signed)
   NAME:  [name], [name]                      ACCOUNT NO.:  [id number]   MEDICAL RECORD NO.:  [id number]                   PATIENT TYPE:  INP   LOCATION:  9372                                 FACILITY:  [facility]   PHYSICIAN:  Juan H. Lily Peer, M.D.             DATE OF BIRTH:  [birth year]   DATE OF PROCEDURE:  07/11/2003  DATE OF DISCHARGE:                                 OPERATIVE REPORT   SURGEON:  Juan H. Lily Peer, M.D.   INDICATION FOR OPERATION:  This 35 year old gravida 5, para 1, AB 3,  currently 74 1/2 weeks' estimated gestational age with history of  incompetent cervix in for McDonald cerclage placement.   PREOPERATIVE DIAGNOSES:  1. Intrauterine pregnancy at 40 1/2 weeks' estimated gestational age.  2. History of cervical incompetence.   POSTOPERATIVE DIAGNOSES:  1. Intrauterine pregnancy at 51 1/2 weeks' estimated gestational age.  2. History of cervical incompetence.   ANESTHESIA:  Spinal.   PROCEDURE PERFORMED:  McDonald cerclage.   DESCRIPTION OF OPERATION:  After the patient was adequately counseled she  was taken to the operating room where she underwent a successful spinal  placement.  She did receive 2 grams of Cefotan.  Of note, there was  difficulty obtaining fetal heart tones before the surgery so bedside  ultrasound confirmed intrauterine fetal viability.  In the operating room  the vagina and perineum were prepped and draped in the usual sterile  fashion.  Clindamycin solution was utilized to cleanse the vagina and the  cervix.  The patient was placed in steep Trendelenburg position and with the  use of round forceps grasping the anterior and posterior cervix and placed  in traction, a 4 silk suture, double-stranded, was placed around the cervix  in a circumferential fashion weaving in and out as close as possible near  the endocervical canal.  The sutures were tied at the 12-1 o'clock position.  The vagina was once again cleansed with Clindamycin  solution.  The patient  was transferred to the recovery room with stable vital signs.  Blood loss  was minimal.  Of note, an in-and-out cath to evacuate the bladder of its  contents before the surgery was approximately 75 mL.  Fluid resuscitation  was 1,000 mL of lactated Ringers.  She did receive 1 liter of lactated  Ringers for IV fluids.                                               Juan H. Lily Peer, M.D.    JHF/MEDQ  D:  07/11/2003  T:  07/12/2003  Job:  [id number]

## 2011-03-11 NOTE — Discharge Summary (Signed)
[facility]  Patient:    [name], [name]                    MRN: [id number] Adm. Date:  [id number] Disc. Date: [id number] Attending:  Tonye Royalty Dictator:   Antony Contras, [facility]                           Discharge Summary  DISCHARGE DIAGNOSES:          Cervical incompetence.  PROCEDURES:                   Rescue cerclage.  HISTORY OF PRESENT ILLNESS:   The patient is a 35 year old, gravida 4, para 0-0-3-0 with LMP, October 18, 2000.  [facility] July 19, 2001.  Prenatal risk factors include a history of SVT secondary to Wolff-Parkinson-White syndrome. The atient is also Rh-negative.  Patient was found to have a cervical length of 1.8 cm on a screening ultrasound done at 19-5/7 weeks.  Prior to that time, she had had an ultrasound at 13 weeks and the cervix was measured at 3.4 cm. She also had a history of recurrent pregnancy losses and possible progesterone insufficiency and had been placed on progesterone suppositories during her first trimester.  She also did have a negative lupus anticoagulant and antiphospholipid antibody screen.  She also had been treated for bacterial vaginosis on February 01, 2001.  She will be admitted for a rescue cerclage.  HOSPITAL COURSE:              The patient was admitted on Mar 05, 2001. Rescue cerclage was performed by Dr. Lily Peer under spinal anesthesia. Postoperatively, patient remained stable.  She was afebrile.  She sustained no uterine contractions postoperatively.  She was able to be discharged in satisfactory condition on her first postoperative day.  DISPOSITION:                  Follow up in the office at the end of the week. Strict bed rest at home. DD:  04/09/01 TD:  04/09/01 Job: [id number]

## 2011-03-11 NOTE — Discharge Summary (Signed)
NAMEVANCE, [name]            ACCOUNT NO.:  [id number]   MEDICAL RECORD NO.:  [id number]          PATIENT TYPE:  INP   LOCATION:  3712                         FACILITY:  [facility]   PHYSICIAN:  Vesta Mixer, M.D. DATE OF BIRTH:  1976-08-19   DATE OF ADMISSION:  12/10/2006  DATE OF DISCHARGE:  12/12/2006                               DISCHARGE SUMMARY   DISCHARGE DIAGNOSES:  1. Pericarditis following a recent atheroablation for atrial flutter.  2. History of atrial flutter.  3. History of AV node ablation with pacemaker implantation.  4. Nausea and vomiting   DISCHARGE MEDICATIONS:  1. Toradol 10 mg every six hours for the next four days as needed.  2. She has been instructed to start Coumadin as previously prescribed.  3. Vicodin 5/500 mg 1-2 tablets every six hours as needed.  4. Phenergan 25 mg every eight hours as needed.  5. Over-the-counter Prilosec as needed.   DISPOSITION:  The patient will follow up with Dr. Early Osmond in 1-2 weeks.  She is to see Dr. Elease Hashimoto in 1-2 weeks.   HISTORY OF PRESENT ILLNESS:  [name] is a 35 year old female  with an extensive history of arrhythmias. I last saw her two or three  years ago for preoperative clearance prior to a breast reduction. She  has not been seen in our office since that time.   She has been seen in [facility] by Dr. Early Osmond. She apparently had  recent episodes of atrial flutter and presented there for radiofrequency  ablation. She had atheroablation on Friday and then presented to our  hospital on February 17 with episodes of chest pain. Clinically she was  diagnosed as having pericarditis and was admitted. Please see dictated  H&P for further details.   HOSPITAL COURSE PER PROBLEMS:  Problem #1. Chest pain. The patient had  very minimal elevation of her cardiac enzymes consistent with  pericarditis. Her clinical symptoms were very classic. She had lots of  chest pain lying down which was relieved with sitting  up. She was  treated with Toradol with relief of her chest discomfort. We changed her  to p.o. Toradol, and she continues to improve. She is having some  nausea, and we have given her some Phenergan.   She had an echocardiogram to rule out pericardial effusion. The  echocardiogram revealed normal left ventricular systolic function. There  was no pericardial effusion.   The patient will follow up with Dr. Early Osmond and Dr. Elease Hashimoto as noted  above. All of her other medical problems remained stable.   We have restarted her Coumadin. She did not know her dose, but she was  told to resume her previous dose and she will follow up with Dr. Early Osmond  for that.           ______________________________  Vesta Mixer, M.D.     PJN/MEDQ  D:  12/12/2006  T:  12/12/2006  Job:  [id number]   cc:   Oren Binet, M.D.

## 2011-03-11 NOTE — Discharge Summary (Signed)
[facility]  Patient:    [name], [name]                    MRN: [id number] Adm. Date:  [id number] Disc. Date: [id number] Attending:  Tonye Royalty Dictator:   Antony Contras, [facility]                           Discharge Summary  DISCHARGE DIAGNOSES:          1. Intrauterine pregnancy at 22-6/[redacted] weeks                                  gestation.                               2. History of supraventricular tachycardia with                                  cervical incompetence with placement of                                  McDonald cervical cerclage.                               3. Preterm labor.  PROCEDURES:                   Tocolysis.  HISTORY OF PRESENT ILLNESS:   The patient is a 35 year old, gravida 4, para 0-0-3-0 with a LMP of October 18, 2000.  EDC of July 09, 2001.  Prenatal risk factors include a history of supraventricular tachycardia.  Patient also has a pacemaker in place and has been followed at [facility] by Dr. Kathyrn Sheriff.  The patient also has had a cervical McDonald cerclage placed on Mar 05, 2001.  PRENTAL LABORATORIES:         Blood type A negative, antibody screen negative, RPR, HBsAg, HIV nonreactive, rubella immune, MSAFP within normal limits.  HOSPITAL COURSE:              The patient presented with complaints of lower abdominal pressure and was found to be contracting every 3-6 minutes.  The cervix was unchanged.  Cerclage was intact.  She was admitted on Mar 21, 2001 and tocolyzed with magnesium sulfate.  She did respond to this measure with lessening of the uterine contractions but she continued to complain of lower abdominal pressure and also reported episodes of tachycardia at 140s. The patient was transferred to ______________ on Mar 23, 2001. DD:  04/09/01 TD:  04/09/01 Job: [id number]

## 2011-03-11 NOTE — H&P (Signed)
[name], [name]            ACCOUNT NO.:  [id number]   MEDICAL RECORD NO.:  [id number]          PATIENT TYPE:  INP   LOCATION:  3712                         FACILITY:  [facility]   PHYSICIAN:  Corky Crafts, MDDATE OF BIRTH:  02-24-1976   DATE OF ADMISSION:  12/10/2006  DATE OF DISCHARGE:                              HISTORY & PHYSICAL   REFERRING PHYSICIAN:  Dr. Delane Ginger and Dr. Philis Kendall at [facility].   REASON FOR ADMISSION:  Chest pain.   HISTORY OF PRESENT ILLNESS:  The patient is a 35 year old who has a long  history of SVT dating back to 69.  At that time she had an AV node  ablation, and pacemaker placement.  She has had several ablations, most  recently she underwent an atrial flutter ablation on the December 08, 2005 at [facility].  She was discharged on Saturday in good  condition.  Her only complaint was right groin pain at the entry site of  the catheters.  Last night she developed severe chest pain which was  worse when lying flat.  It was significant better when she would sit up  but still quite painful.  She has had some shortness of breath when  lying flat as well along with chest pain.  Currently she is in severe  pain.  She is also quite nauseated and has vomited.  She received  Dilaudid in the emergency room without any relief.   PAST MEDICAL HISTORY:  1. Atrial flutter.  2. Prior SVT.  3. She states she has a history of congestive heart failure which      prompted this most recent ablation.   PAST SURGICAL HISTORY:  1. Breast reduction surgery.  2. C-section.  3. Multiple ablation.   ALLERGIES:  PERCOCET.  SHE DOES TOLERATE TYLENOL.   MEDICATIONS:  Coumadin, Lovenox, Darvocet and Lasix.   SOCIAL HISTORY:  The patient does not smoke.  She occasionally drink  alcohol. She works for [employer].   FAMILY HISTORY:  No early coronary artery disease.   REVIEW OF SYSTEMS:  Significant for chest pain worse with lying  flat.  Shortness of breath as well.  She has right groin pain and she has  significant nausea and has vomited.  She denies any fever or chills.  She has not had weight loss.  She denies any focal neurologic deficits.  All other systems negative.   PHYSICAL EXAMINATION:  Pulse 60.  HEENT:  Normocephalic, atraumatic.  Eyes extraocular movements intact.  Neck no carotid bruits.  CARDIOVASCULAR:  Regular rate and rhythm.  S1 and S2.  LUNGS:  Clear to auscultation bilaterally.  ABDOMEN:  Soft, nontender, nondistended .  EXTREMITIES:  Showed no right groin hematoma.  Tender to palpation.  No  peripheral edema.  NEURO:  No focal motor sensory deficits.  SKIN:  No rash.  PSYCH:  Normal mood and affect.   EKG shows a paced rhythm.  Preliminary echo results shows normal left  ventricular function, no pericardial infusion.  Troponin initial set  less 0.05, second set MB of 2.8 and troponin  of 0.17.  Hematocrit of 34.   MEDICAL DECISION MAKING:  A 35 year old status post ablation with a  clinical history consistent with pericarditis.   PLAN:  1. We will observe overnight because of her severe pain, nausea and      vomiting.  Check echocardiogram.  2. We will give Toradol for now, we will switch to p.o.      antiinflammatories tomorrow.  3. Restart Lovenox and Coumadin as well because of her recent atrial      flutter.  4. Patient will be followed up by Dr. Elease Hashimoto.      Corky Crafts, MD  Electronically Signed     JSV/MEDQ  D:  12/10/2006  T:  12/11/2006  Job:  [id number]

## 2011-03-11 NOTE — H&P (Signed)
[facility]  Patient:    [name]                      MRN: [id number] Adm. Date:  04/17/01 Attending:  Gaetano Hawthorne. Lily Peer, M.D.                         History and Physical  CHIEF COMPLAINT:              Contractions.  HISTORY:                      Patient is a 35 year old gravida 4, para 0, AB3 at 64 6/[redacted] weeks gestation who presented to [facility] early this morning complaining of lower abdominal pressure and contractions increasing more in frequency.  Patient with known history of cervical incompetence and had cerclage placed at [redacted] weeks gestation.  Patient has been admitted on several occasions for premature contractions.  Her last episode was on May 31 when she had to be transferred to [facility] due to the fact that while being placed on tocolytics such as magnesium sulfate she had some pulmonary edema. Of note, the patient has a history of supraventricular tachycardia and has an ongoing pacemaker.  She had been discharged home on June 4 when she was 23 5/7 weeks.  She had been seen by the cardiologists there at [facility].  An echocardiogram had demonstrated that there was a normal ejection fraction. Per cardiologists recommendations she had been aggressively diuresed with Lasix which had improved her symptoms and her laboratories and was subsequently discharged home.  When she was discharged home she was prescribed Lasix 40 mg q.d. p.r.n. and she was to follow also with her cardiologist in [facility] which she has done recently.  When she arrived to [facility] she was placed on the monitor and there were some contractions noted, but very mild.  By the time that I came to the hospital her contractions had responded with a fluid bolus of LR which she received 250 cc followed by maintenance of 100 cc/hour.  She was given Motrin 800 mg since tocolytics are contraindicated in her in an effort to quiet down any  irritation to the uterus contributing to the contractions and an ultrasound was ordered also.  Her cervix was found to be very short.  The cerclage was felt, the knot at 6 oclock and there was no dilatation with hardly any cervix remaining as has been the case for several weeks now.  PAST MEDICAL HISTORY:         She has had three spontaneous ABs in the past with a negative workup, history of supraventricular tachycardia, history of pacemaker, breast reduction surgery, liposuction, severe prior cardiac ablations.  PHYSICAL EXAMINATION  HEENT:                        Unremarkable.  NECK:                         Supple.  Trachea midline.  No carotid bruits. No thyromegaly.  LUNGS:                        Clear to auscultation without rhonchi or wheezes.  HEART:  Regular rate and rhythm.  No murmurs or gallops.  BREASTS:                      Not done.  ABDOMEN:                      Soft, nontender.  PELVIC:                       Cervix:  Short.  Cerclage suture knot felt at 6 oclock.  No dilatation.  No ballooning effect palpated on the lower uterine segment.  Ballottable presentation.  EXTREMITIES:                  DTRs 1+.  Negative clonus.  PRENATAL LABORATORIES:        A- blood type.  Negative antibody screen.  VDRL nonreactive.  Rubella immune.  Hepatitis B surface antigen, HIV were negative. Alpha fetoprotein was normal.  Pap smear was normal.  She has had GBS cultures done in the past which have been negative.  ADMISSION LABORATORIES:       This morning after having received her steroids her white blood count was 12.1, hemoglobin and hematocrit 10.6 and 30.5 respectively with a platelet count 203,000.  Her electrolytes were normal. Urinalysis:  Specific gravity 1.020.  All other parameters were negative.  On the microscopic studies a small amount of leukocytes, many epis, 3-6 white blood cells, many bacteria.  Urine culture was submitted.  Her wet  prep:  Few wbcs seen.  Ultrasound demonstrated the fetus to be in the breech presentation.  No discernible measurement of the cervix.  Cerclage was seen.  Estimated fetal weight placed the fetus in the 10th percentile for [redacted] weeks gestation. Amniotic fluid index was reported to be normal.  Placenta was anterior grade 2.  ASSESSMENT:                   A 35 year old gravida 4, para 0, AB3 at 54 6/[redacted] weeks gestation admitted with premature contractions resolved with intravenous fluids and initiation of Motrin.  Will keep her on Motrin 400 mg q.6h. for a 48 hour course.  She received at 0200 hours betamethasone 12.5 mg IM.  Will have a second dose repeated in 12 hours in an effort to enhance fetal lung maturity.  We are awaiting the result of the urine culture.  Will continue at bed rest and intravenous hydration and if all quiet and stable will discharge home tomorrow.  This morning patient was no longer having back discomfort. There were no contractions seen.  Reassuring fetal heart rate tracing.  Vital signs were stable.  Afebrile.  She will be started on her regular diet. Seeing as how she is 27 weeks and Rh negative she will receive a RhoGAM 300 mcg IM today.  Once again it was stressed to the patient the importance that when she is released home that she needs to be at strict bed rest in effort to minimize the pressure to the lower uterine segment so we can allow more time for this baby to be mature.  The patient was made aware that the baby has intrauterine growth restriction and the importance of her to be at bed rest.  PLAN:                         As per assessment above. DD:  04/17/01 TD:  04/17/01  Job: [id number]

## 2011-03-11 NOTE — H&P (Signed)
[facility]  Patient:    [name], [name]                    MRN: [id number] Adm. Date:  [id number] Disc. Date: [id number] Attending:  Tonye Royalty                         History and Physical  CHIEF COMPLAINT:              Contractions.  HISTORY:                      The patient is a 35 year old gravida 4, para 0, AB3 with a last menstrual period of December 26 and an estimated date of confinement of July 19, 2001, currently 22-6/[redacted] weeks gestation.  The patient presented to the hospital this morning complaining of lower abdominal pressure starting earlier this morning.  The patient with known history of cervical incompetence.  She had a McDonald cervical cerclage placed on May 13 and has been treated on several occasions for bacterial vaginosis.  Most recently, she was placed on two weeks of Flagyl 500 mg b.i.d., started last week.  Also of note, the patient also has a history of supraventricular tachycardia, for which she has a pacemaker.  The patient was placed on the monitor and initially had some irritability and, then, her contractions started to pick up every 3 to 6/12 minutes apart.  Fetal heart tones were in the 160 beats per minute range.  Her vital signs were: Temperature 98.5, pulse 59, respirations 18, blood pressure 109/60.  On examination, the McDonald cerclage was visualized and palpated.  The suture was at the 6 oclock position.  There was no description of the patient having any leakage of fluid, rupture of membranes or bloody discharge.   GBS culture was obtained.  PAST MEDICAL HISTORY:         History of supraventricular tachycardia, for which she has a pacemaker in place.  She has been followed at [facility] by Dr. Kathyrn Sheriff, telephone number [phone] and [phone].  The patient has had mostly phone monitoring of her pacemaker and has been evaluated by her cardiologist every six months.  She has  otherwise been doing well from that standpoint, and was recently supposed to have had an appointment with him, which information is still pending.  The patient has had three spontaneous abortions.  In the past, she has had a negative workup. During this pregnancy, she was placed on Progesterone suppositories for the first trimester.  The patient had pneumonia as a child.  She had breast reduction and liposuction in 1995.  Her pacemaker was placed in 1997.  The patient, at times, has suffered from anxiety and depression.  ALLERGIES:                    Percocet.  REVIEW OF SYSTEMS:            See hospital form.  PHYSICAL EXAMINATION:  VITAL SIGNS:                  Temperature 98.5, pulse 59, respirations 18, blood pressure 109/60.  HEENT:                        Unremarkable.  NECK:  Supple.  Trachea midline.  No carotid bruits. No thyromegaly.  LUNGS:                        Clear to auscultation without rhonchi or wheezes.  HEART:                        Regular rate and rhythm without any murmurs or gallops.  BREASTS:                      Not done.  ABDOMEN:                      Soft and nontender.  PELVIC:                       Bartholins, urethra and Skeins glands within normal limits.  The vagina and cervix who no gross lesions on inspection.  The cervix was short.  McDonald cerclage suture noted at the 6 oclock position.  EXTREMITIES:                  DTRs 1+.  Negative clonus.  PRENATAL LABORATORY DATA:     Blood type A negative.  Negative antibody screen.  VDRL nonreactive.  Rubella immune.  Hepatitis B surface antigen and HIV were negative.  Alplha-fetoprotein was normal.  Pap smear was normal.  ASSESSMENT:                   A 35 year old gravida 4, para 0, AB3, currently 22-6/[redacted] weeks gestation with a history of cervical incompetence.  She had McDonald cervical cerclage placed on May 13.  She presented with complaints of lower abdominal  pressure and was found to be contracting every 3-6 minutes. The cervix was unchanged.  Cerclage intact.  The patient will be admitted and will be placed on tocolytics such as magnesium sulfate.  Will hold off on Terbutaline due to the fact that she has a history of supraventricular tachycardia, for which she has had a pacemaker.  Will continue to keep her on p.o. Flagyl 500 mg b.i.d. as previously prescribed.  Will await the results of the GBS culture.  Will proceed also with obtaining an ultrasound in the morning for cervical length measurement as well as for fetal parameters.  will also proceed with getting baseline admission labs such as CBC and comprehensive metabolic panel.  Her clean-catch urinalysis was essentially negative in the emergency room today.  Will also have the patient be at strict bed rest, as well.  PLAN:                         As per assessment above. DD:  03/21/01 TD:  03/21/01 Job: [id number]

## 2011-04-22 HISTORY — PX: CARDIAC SURGERY: SHX584

## 2011-06-03 ENCOUNTER — Other Ambulatory Visit: Payer: Self-pay | Admitting: *Deleted

## 2011-06-03 MED ORDER — LEVOTHYROXINE SODIUM 25 MCG PO TABS: 25.0000 ug | ORAL_TABLET | Freq: Every day | ORAL | Status: DC

## 2011-06-03 NOTE — Telephone Encounter (Signed)
PT INFORMED WITH THE BELOW. PT WILL CALL BACK TO MAKE AEX APPOINTMENT.

## 2011-06-03 NOTE — Telephone Encounter (Signed)
PT CALLED ASKING FOR REFILL ON HER SYNTHROID RX.

## 2011-08-08 ENCOUNTER — Other Ambulatory Visit: Payer: Self-pay | Admitting: Gynecology

## 2011-08-08 NOTE — Telephone Encounter (Signed)
Pt has annual scheduled on 07/27/11.

## 2011-08-24 ENCOUNTER — Encounter: Payer: Self-pay | Admitting: Gynecology

## 2011-09-01 ENCOUNTER — Encounter: Payer: Self-pay | Admitting: Gynecology

## 2012-04-05 ENCOUNTER — Ambulatory Visit (INDEPENDENT_AMBULATORY_CARE_PROVIDER_SITE_OTHER): Payer: 59 | Admitting: Gynecology

## 2012-04-05 ENCOUNTER — Encounter: Payer: Self-pay | Admitting: Gynecology

## 2012-04-05 VITALS — BP 120/70 | Ht 63.75 in | Wt 185.0 lb

## 2012-04-05 DIAGNOSIS — Z01419 Encounter for gynecological examination (general) (routine) without abnormal findings: Secondary | ICD-10-CM

## 2012-04-05 DIAGNOSIS — I471 Supraventricular tachycardia: Secondary | ICD-10-CM | POA: Insufficient documentation

## 2012-04-05 DIAGNOSIS — N938 Other specified abnormal uterine and vaginal bleeding: Secondary | ICD-10-CM

## 2012-04-05 DIAGNOSIS — I4892 Unspecified atrial flutter: Secondary | ICD-10-CM | POA: Insufficient documentation

## 2012-04-05 DIAGNOSIS — Z950 Presence of cardiac pacemaker: Secondary | ICD-10-CM | POA: Insufficient documentation

## 2012-04-05 DIAGNOSIS — N925 Other specified irregular menstruation: Secondary | ICD-10-CM

## 2012-04-05 DIAGNOSIS — N949 Unspecified condition associated with female genital organs and menstrual cycle: Secondary | ICD-10-CM

## 2012-04-05 DIAGNOSIS — I319 Disease of pericardium, unspecified: Secondary | ICD-10-CM | POA: Insufficient documentation

## 2012-04-05 NOTE — Progress Notes (Signed)
[name] 04-15-1976 [id number]   History:    36 y.o.  for annual gyn exam who has not been seen the office since 2011. Patient stated that she feels her right breast larger than her left at times and tender. She had a normal mammogram in 2011. She denies palpating in any discernible mass. Patient has been followed by the cardiologist at [facility] for her supraventricular tachycardia for which she has had atrial floaters in the past and has a pacemaker. She had pericarditis episode and was hospitalized in January this year. She is complaining of some dysfunction uterine bleeding for the past 2 months. She's had a previous tubal sterilization procedure. She's also complained at times postcoital bleeding. Her last Pap smear was normal in 2011.  Past medical history,surgical history, family history and social history were all reviewed and documented in the EPIC chart.  Gynecologic History Patient's last menstrual period was 03/13/2012. Contraception: tubal ligation Last Pap: 2012. Results were: normal Last mammogram: 2011. Results were: normal  Obstetric History OB History    Grav Para Term Preterm Abortions TAB SAB Ect Mult Living   5 2  2 3  3   2      # Outc Date GA Lbr Len/2nd Wgt Sex Del Anes PTL Lv   1 PRE     M CS  Yes Yes   2 PRE     F CS  Yes Yes   3 SAB            4 SAB            5 SAB                ROS: A ROS was performed and pertinent positives and negatives are included in the history.  GENERAL: No fevers or chills. HEENT: No change in vision, no earache, sore throat or sinus congestion. NECK: No pain or stiffness. CARDIOVASCULAR: No chest pain or pressure. No palpitations. PULMONARY: No shortness of breath, cough or wheeze. GASTROINTESTINAL: No abdominal pain, nausea, vomiting or diarrhea, melena or bright red blood per rectum. GENITOURINARY: No urinary frequency, urgency, hesitancy or dysuria. MUSCULOSKELETAL: No joint or muscle pain, no back pain, no recent  trauma. DERMATOLOGIC: No rash, no itching, no lesions. ENDOCRINE: No polyuria, polydipsia, no heat or cold intolerance. No recent change in weight. HEMATOLOGICAL: No anemia or easy bruising or bleeding. NEUROLOGIC: No headache, seizures, numbness, tingling or weakness. PSYCHIATRIC: No depression, no loss of interest in normal activity or change in sleep pattern.     Exam: chaperone present  BP 120/70  Ht 5' 3.75" (1.619 m)  Wt 185 lb (83.915 kg)  BMI 32.00 kg/m2  LMP 03/13/2012  Body mass index is 32.00 kg/(m^2).  General appearance : Well developed well nourished female. No acute distress HEENT: Neck supple, trachea midline, no carotid bruits, no thyroidmegaly Lungs: Clear to auscultation, no rhonchi or wheezes, or rib retractions  Heart: Regular rate and rhythm, no murmurs or gallops Breast:Examined in sitting and supine position were symmetrical in appearance, no palpable masses or tenderness,  no skin retraction, no nipple inversion, no nipple discharge, no skin discoloration, no axillary or supraclavicular lymphadenopathy Abdomen: no palpable masses or tenderness, no rebound or guarding Extremities: no edema or skin discoloration or tenderness  Pelvic:  Bartholin, Urethra, Skene Glands: Within normal limits             Vagina: No gross lesions or discharge  Cervix: No gross lesions or discharge  Uterus  anteverted, normal size, shape and consistency, non-tender and mobile  Adnexa  Without masses or tenderness  Anus and perineum  normal   Rectovaginal  normal sphincter tone without palpated masses or tenderness             Hemoccult not done     Assessment/Plan:  36 y.o. female for annual exam with the past 2 months with spotting in between periods and some cycles heavier than others. Patient with prior tubal sterilization procedure. She will return back to the office in a few weeks for sonohysterogram to rule out any intrauterine pathology. Her primary physician has done her  lab work so no lab work was drawn today. No Pap smear done today, new screening guidelines discussed.     Ok Edwards MD, 5:21 PM 04/05/2012

## 2012-04-05 NOTE — Patient Instructions (Signed)
Alopecia Areata Alopecia areata is a self-destructing (autoimmune) disease that results in the loss of hair. In this condition your body's immune system attacks the hair follicle. The hair follicle is responsible for growing hair. Hair loss can occur on the scalp and other parts of the body. It usually starts as one or more small, round, smooth patches of hair loss. It occurs in males and females of all ages and races, but usually starts before age 37. The scalp is the most commonly affected area, but the beard or any hair-bearing site can be affected. This type of hair loss does not leave scars where the hair was lost.  Many people with alopecia areata only have a few patches of hair loss. In others, extensive patchy hair loss occurs. In a few people, all scalp hair is lost (alopecia totalis), or hair is lost from the entire scalp and body (alopecia universalis). No matter how widespread the hair loss, the hair follicles remain alive and are ready to resume normal hair production whenever they receive the correct signal. Hair re-growth may occur without treatment and can even restart after years of hair loss.  CAUSES  It is thought that something triggers the immune system to stop hair growth. It is not always known what the cause is. Some people have genetic markers that can increase the chance of developing alopecia areata. Alopecia areata often occurs in families whose members have had:  Asthma.   Hay fever.   Atopic eczema.   Some autoimmune diseases may also be a trigger, such as:   Thyroid disease.   Diabetes.   Rheumatoid arthritis.   Lupus erythematosus.   Vitiligo.   Pernicious anemia.   Addison's disease.  OTHER SYMPTOMS In some people, the nail beds may develop rows of tiny dents (stippling) or the nail beds can become distorted. Other than the hair and nail beds, no other body part is affected.  PROGNOSIS  Alopecia areata is not medically disabling. People with alopecia  areata are usually in excellent health. Hair loss can be emotionally difficult. The National Alopecia Areata Foundation has resources available to help individuals and families with alopecia areata. Their goal is to help people with the condition live full, productive lives. There are many successful, well-adjusted, contented people living with Alopecia areata. Alopecia areata can be overcome with:  A positive self image.   Sound medical facts.   The support of others, such as:   Sometimes professional counseling is helpful to develop one's self-confidence and positive self-image.  TREATMENT  There is no cure for alopecia areata. There are several available treatments. Treatments are most effective in milder cases. No treatment is effective for everyone. Choice of treatment depends mainly on a person's age and the extent of their hair loss. Alopecia areata occurs in two forms:   A mild patchy form where less than 50 percent of scalp hair is lost.   An extensive form where greater than 50 percent of scalp hair is lost.  These two forms of alopecia areata behave quite differently, and the choice of treatment depends on which form is present. Current treatments do not turn alopecia areata off. They can stimulate the hair follicle to produce hair.  Some medications used to treat mild cases include:  Cortisone injections. The most common treatment is the injection of cortisone into the bare skin patches. The injections are usually given by a caregiver specializing in skin issues (dermatologist). This caregiver will use a tiny needle to give multiple   injections into the skin in and around the bare patches. The injections are repeated once a month. If new hair growth occurs, it is usually visible within 4 weeks. Treatment does not prevent new patches of hair loss from developing. There are few side effects from local cortisone injections. Occasionally, temporary dents (depressions) in the skin result  from the local injections, but these dents can fill in by themselves.   Topical minoxidil. Five percent topical minoxidil solution applied twice daily may grow hair in alopecia areata. Scalp, eyebrows, and beard hair may respond. If scalp hair re-grows completely, treatment can be stopped. Response may improve if topical cortisone cream is applied 30 minutes after the minoxidil. Topical minoxidil is safe, easy to use, and does not lower blood pressure in persons with normal blood pressure. Minoxidil can lead to unwanted facial hair growth in some people.   Anthralin cream or ointment. Another treatment is the application of anthralin cream or ointment. Anthralin is a tar-like substance that has been used widely for psoriasis. Anthralin is applied to the bare patches once daily. It is washed off after a short time, usually 30 to 60 minutes later. If new hair growth occurs, it is seen in 8 to 12 weeks. Anthralin can be irritating to the skin. It can cause temporary, brownish discoloration of the treated skin. By using short treatment times, skin irritation and skin staining are reduced without decreasing effectiveness. Care must be taken not to get anthralin in the eyes.  Some of the medications used for more extensive cases where there is greater than 50% hair loss include:  Cortisone pills. Cortisone pills are sometimes given for extensive scalp hair loss. Cortisone taken internally is much stronger than local injections of cortisone into the skin. It is necessary to discuss possible side effects of cortisone pills with your caregiver. In general, however, cortisone pills are used in relatively few patients with alopecia areata due to health risks from prolonged use. Also, hair that has grown is likely to fall out when the cortisone pills are stopped.   Topical minoxidil. See previous explanation under mild, patchy alopecia areata. However, minoxidil is not effective in total loss of scalp hair (alopecia  totalis).   Topical immunotherapy. Another method of treating alopecia areata or alopecia totalis/universalis involves producing an allergic rash or allergic contact dermatitis. Chemicals such as diphencyprone (DPCP) or squaric acid dibutyl ester (SADBE) are applied to the scalp to produce an allergic rash which resembles poison oak or ivy. Approximately 40% of patients treated with topical immunotherapy will re-grow scalp hair after about 6 months of treatment. Those who do successfully re-grow scalp hair will need to continue treatment to maintain hair re-growth.   Wigs. For extensive hair loss, a wig can be an important option for some people. Proper attention will make a quality wig look completely natural. A wig will need to be cut, thinned, and styled. To keep a net base wig from falling off, special double-sided tape can be purchased in beauty supply outlets and fastened to the inside of the wig.   For those with completely bare heads, there are suction caps to which any wig can be attached. There are also entire suction cap wig units.  FOR MORE INFORMATION National Alopecia Areata Foundation: www.naaf.org Document Released: 05/14/2004 Document Revised: 09/29/2011 Document Reviewed: 12/16/2008 Exit[employer] Patient Information 2012 [employer]. 

## 2012-04-23 ENCOUNTER — Other Ambulatory Visit: Payer: Self-pay | Admitting: Gynecology

## 2012-04-23 DIAGNOSIS — N938 Other specified abnormal uterine and vaginal bleeding: Secondary | ICD-10-CM

## 2012-04-30 ENCOUNTER — Ambulatory Visit (INDEPENDENT_AMBULATORY_CARE_PROVIDER_SITE_OTHER): Payer: 59 | Admitting: Gynecology

## 2012-04-30 ENCOUNTER — Ambulatory Visit (INDEPENDENT_AMBULATORY_CARE_PROVIDER_SITE_OTHER): Payer: 59

## 2012-04-30 DIAGNOSIS — N831 Corpus luteum cyst of ovary, unspecified side: Secondary | ICD-10-CM

## 2012-04-30 DIAGNOSIS — N925 Other specified irregular menstruation: Secondary | ICD-10-CM

## 2012-04-30 DIAGNOSIS — N938 Other specified abnormal uterine and vaginal bleeding: Secondary | ICD-10-CM

## 2012-04-30 DIAGNOSIS — N949 Unspecified condition associated with female genital organs and menstrual cycle: Secondary | ICD-10-CM

## 2012-04-30 DIAGNOSIS — N920 Excessive and frequent menstruation with regular cycle: Secondary | ICD-10-CM

## 2012-04-30 NOTE — Progress Notes (Signed)
[name] is an 36 y.o. female. Gravida 5 para 2 AB 3 who was seen in the office on June 13 for annual exam had been complaining of menorrhagia for the past 4 months whereby her cycles would last approximately 3 weeks. Patient presented to the office today for a planned sonohysterogram. The ultrasound demonstrated the following:  Uterus measured 9.5 x 6.3 x 4.8 cm with endometrial stripe of 5.8 mm. Right ovary thickwalled cyst internal low level echoes measuring 24 x 19 x 20 mm suspicious from retching cyst. Left ovary was otherwise normal. Sonohysterogram no intracavitary defect.  Endometrial biopsy was not possible due to patient's discomfort and stenotic os which had required some dilatation in an effort to feed the sonohysterogram catheter.  Patient has been followed by Dr.Mounsey cardiologist at [facility] due to patient's past history of supraventricular tachycardia (3 heart block) as well as atrial flutter. Patient currently has a pacemaker in place. Patient had been on anticoagulation (Coumadin) until January of this year when patient had been hospitalized as a result of pericarditis.   Pertinent Gynecological History: Menses: usually lasting 14 to 21 days Bleeding: As per above  Contraception: tubal ligation DES exposure: denies Blood transfusions: none Sexually transmitted diseases: HSV  Previous GYN Procedures: 2 prior cesarean sections and tubal ligation  Last mammogram: normal Date: 2011  Last pap: normal Date: 2011  OB History: G5, P2   Menstrual History: Menarche age: 58  Patient's last menstrual period was 04/13/2012.    Past Medical History  Diagnosis Date  . HSV-2 (herpes simplex virus 2) infection   . Spontaneous abortion     x3  . Hypothyroid   . Pacemaker   . SVT (supraventricular tachycardia)     3 DEGREE HEART BLOCK/ATRIAL FI    Past Surgical History  Procedure Date  . Cesarean section 2005    WITH BTL  . Tubal ligation 2005    AT C/S   . Breast  surgery 05/2000    REDUCTION MAMMAPLASTY  . Pacemaker insertion     NEW ONE INSERTED 11/10  . Cholecystectomy 11/2009  . Cardiac surgery April 22 2011,     Family History  Problem Relation Age of Onset  . Heart disease Father   . Cancer Brother     SKIN  . Cancer Maternal Grandmother     OVARIAN  . Breast cancer Paternal Grandmother     Social History:  reports that she has never smoked. She has never used smokeless tobacco. She reports that she drinks alcohol. She reports that she does not use illicit drugs.  Allergies:  Allergies  Allergen Reactions  . Percocet (Oxycodone-Acetaminophen)      (Not in a hospital admission)  REVIEW OF SYSTEMS: A ROS was performed and pertinent positives and negatives are included in the history.  GENERAL: No fevers or chills. HEENT: No change in vision, no earache, sore throat or sinus congestion. NECK: No pain or stiffness. CARDIOVASCULAR: No chest pain or pressure. No palpitations. PULMONARY: No shortness of breath, cough or wheeze. GASTROINTESTINAL: No abdominal pain, nausea, vomiting or diarrhea, melena or bright red blood per rectum. GENITOURINARY: No urinary frequency, urgency, hesitancy or dysuria. MUSCULOSKELETAL: No joint or muscle pain, no back pain, no recent trauma. DERMATOLOGIC: No rash, no itching, no lesions. ENDOCRINE: No polyuria, polydipsia, no heat or cold intolerance. No recent change in weight. HEMATOLOGICAL: No anemia or easy bruising or bleeding. NEUROLOGIC: No headache, seizures, numbness, tingling or weakness. PSYCHIATRIC: No depression, no loss of  interest in normal activity or change in sleep pattern.     Last menstrual period 04/13/2012.  Physical Exam:  HEENT:unremarkable Neck:Supple, midline, no thyroid megaly, no carotid bruits Lungs:  Clear to auscultation no rhonchi's or wheezes Heart:Regular rate and rhythm, no murmurs or gallops Breast Exam:Unremarkable  Abdomen:Soft nontender no rebound or guarding  Pfannenstiel incision  Pelvic:BUSwithin normal limits  Vagina:No lesions or discharge  Cervix:Short and stenotic  Uterus:Anteverted normal size shape and consistency  Adnexa:No palpable masses or tenderness  Extremities: No cords, no edema Rectal:Not examined  No results found for this or any previous visit (from the past 24 hour(s)).  No results found.  Assessment/Plan: Patient with menorrhagia. Unable to do an endometrial biopsy due to a stenotic cervical os. Small ultrasound catheter was able to be fed to the uterine cavity to do the sonohysterogram whereby no intracavitary defects were noted. Patient has a small right hemorrhagic cyst. Due to patient's worsening dysmenorrhea and menorrhagia she would like to proceed with hysterectomy. Due to patient's complexity of her cardiovascular disease (supraventricular tachycardia/third-degree heart block history) and her cardiologist being at [facility] we are going to refer her to the GYN clinic there for further evaluation and plan on her having her hysterectomy with a joint team effort with the cardiology department at there institution. Patient stated that she has had in the past history of DVT so hormonal manipulation with oral contraceptive pill or Lysteda is contraindicated. We'll send them a copy of this note for her their evaluation. Her primary physician had done her lab work several months ago.  Shavonta Gossen H 04/30/2012, 1:03 PM

## 2012-05-04 ENCOUNTER — Telehealth: Payer: Self-pay

## 2012-05-04 DIAGNOSIS — N920 Excessive and frequent menstruation with regular cycle: Secondary | ICD-10-CM

## 2012-05-04 NOTE — Telephone Encounter (Signed)
Per Gearldine Bienenstock at Dr. Venetia Maxon office, appt. 05-08-12 at 3 p.m. Pt. States she will be able to to go to this appt. Amy had already faxed records and referral on 05-01-12.

## 2012-06-08 ENCOUNTER — Telehealth: Payer: Self-pay | Admitting: *Deleted

## 2012-06-08 MED ORDER — ALPRAZOLAM 0.5 MG PO TABS: 0.5000 mg | ORAL_TABLET | Freq: Two times a day (BID) | ORAL | Status: AC

## 2012-06-08 NOTE — Telephone Encounter (Signed)
Please call in Xanax 0.5 mg to take 1 by mouth daily when necessary #30 with 2 refills

## 2012-06-08 NOTE — Telephone Encounter (Signed)
Rx called in. Pt informed. KW

## 2012-06-08 NOTE — Telephone Encounter (Signed)
Pt is asking for a Xanax prescription. She is having difficulty in her marriage. The last time you had given her a Xanax prescription was when her sister passed away.  She only needs this short term. She is scheduled to have her hyst 8/28 at [facility]. Please advise if ok to call in

## 2012-06-24 HISTORY — PX: ABDOMINAL HYSTERECTOMY: SHX81

## 2012-09-18 ENCOUNTER — Other Ambulatory Visit: Payer: Self-pay | Admitting: Gynecology

## 2012-09-19 NOTE — Telephone Encounter (Signed)
Rx called to pharmacy

## 2012-12-05 ENCOUNTER — Emergency Department (HOSPITAL_COMMUNITY): Payer: 59

## 2012-12-05 ENCOUNTER — Encounter (HOSPITAL_COMMUNITY): Payer: Self-pay

## 2012-12-05 ENCOUNTER — Emergency Department (HOSPITAL_COMMUNITY)
Admission: EM | Admit: 2012-12-05 | Discharge: 2012-12-06 | Disposition: A | Discharge status: Home / Self Care | Payer: 59 | Attending: Emergency Medicine | Admitting: Emergency Medicine

## 2012-12-05 DIAGNOSIS — E039 Hypothyroidism, unspecified: Secondary | ICD-10-CM | POA: Insufficient documentation

## 2012-12-05 DIAGNOSIS — Z950 Presence of cardiac pacemaker: Secondary | ICD-10-CM | POA: Insufficient documentation

## 2012-12-05 DIAGNOSIS — Z79899 Other long term (current) drug therapy: Secondary | ICD-10-CM | POA: Insufficient documentation

## 2012-12-05 DIAGNOSIS — Z7982 Long term (current) use of aspirin: Secondary | ICD-10-CM | POA: Insufficient documentation

## 2012-12-05 DIAGNOSIS — R079 Chest pain, unspecified: Secondary | ICD-10-CM | POA: Insufficient documentation

## 2012-12-05 DIAGNOSIS — Z8619 Personal history of other infectious and parasitic diseases: Secondary | ICD-10-CM | POA: Insufficient documentation

## 2012-12-05 DIAGNOSIS — R0602 Shortness of breath: Secondary | ICD-10-CM | POA: Insufficient documentation

## 2012-12-05 LAB — CBC WITH DIFFERENTIAL/PLATELET
Basophils Absolute: 0 10*3/uL (ref 0.0–0.1)
Eosinophils Relative: 0 % (ref 0–5)
HCT: 42.2 % (ref 36.0–46.0)
Hemoglobin: 15.2 g/dL — ABNORMAL HIGH (ref 12.0–15.0)
Lymphocytes Relative: 3 % — ABNORMAL LOW (ref 12–46)
MCV: 83.6 fL (ref 78.0–100.0)
Monocytes Absolute: 0.3 10*3/uL (ref 0.1–1.0)
Monocytes Relative: 2 % — ABNORMAL LOW (ref 3–12)
Neutro Abs: 15.3 10*3/uL — ABNORMAL HIGH (ref 1.7–7.7)
RDW: 13.5 % (ref 11.5–15.5)
WBC: 16.2 10*3/uL — ABNORMAL HIGH (ref 4.0–10.5)

## 2012-12-05 LAB — D-DIMER, QUANTITATIVE: D-Dimer, Quant: <0.27 ug/mL-FEU (ref 0.00–0.48)

## 2012-12-05 LAB — POCT I-STAT TROPONIN I: Troponin i, poc: 0 ng/mL (ref 0.00–0.08)

## 2012-12-05 LAB — PROTIME-INR: Prothrombin Time: 14.5 seconds (ref 11.6–15.2)

## 2012-12-05 MED ORDER — MORPHINE SULFATE 4 MG/ML IJ SOLN
4.0000 mg | Freq: Once | INTRAMUSCULAR | Status: AC
  Administered 2012-12-05: 4 mg via INTRAVENOUS
  Filled 2012-12-05: qty 1

## 2012-12-05 MED ORDER — ASPIRIN 81 MG PO CHEW
81.0000 mg | CHEWABLE_TABLET | Freq: Once | ORAL | Status: AC
  Administered 2012-12-05: 81 mg via ORAL
  Filled 2012-12-05: qty 1

## 2012-12-05 MED ORDER — ONDANSETRON HCL 4 MG/2ML IJ SOLN
4.0000 mg | Freq: Once | INTRAMUSCULAR | Status: AC
  Administered 2012-12-05: 4 mg via INTRAVENOUS
  Filled 2012-12-05: qty 2

## 2012-12-05 MED ORDER — ONDANSETRON HCL 4 MG/2ML IJ SOLN: 4.0000 mg | Freq: Once | INTRAMUSCULAR | Status: DC

## 2012-12-05 NOTE — ED Notes (Signed)
Per EMS: Pt has had chest pressure since 1300 today. Reports stabbing pain to central chest around pacemaker that radiates to left shoulder. Pt reports SOB, N/V. Pt has pacemaker, 100% paced. Pt has not taken any rx. Pt AO x4.

## 2012-12-05 NOTE — ED Provider Notes (Addendum)
History     CSN: [id number]  Arrival date & time 12/05/12  2229   First MD Initiated Contact with Patient 12/05/12 2249      Chief Complaint  Patient presents with  . Chest Pain    (Consider location/radiation/quality/duration/timing/severity/associated sxs/prior treatment) Patient is a 37 y.o. female presenting with chest pain. The history is provided by the patient.  Chest Pain Pain location:  L chest Pain quality: aching   Pain radiates to:  L arm Pain radiates to the back: no   Pain severity:  Moderate Onset quality:  Gradual Duration:  10 hours Timing:  Constant Progression:  Unchanged Chronicity:  New Context: breathing   Relieved by:  Nothing Worsened by:  Deep breathing Ineffective treatments:  None tried Associated symptoms comment:  Shortness of breath   Past Medical History  Diagnosis Date  . HSV-2 (herpes simplex virus 2) infection   . Spontaneous abortion     x3  . Hypothyroid   . Pacemaker   . SVT (supraventricular tachycardia)     3 DEGREE HEART BLOCK/ATRIAL FI    Past Surgical History  Procedure Laterality Date  . Cesarean section  2005    WITH BTL  . Tubal ligation  2005    AT C/S   . Breast surgery  05/2000    REDUCTION MAMMAPLASTY  . Pacemaker insertion      NEW ONE INSERTED 11/10  . Cholecystectomy  11/2009  . Cardiac surgery  April 22 2011,     Family History  Problem Relation Age of Onset  . Heart disease Father   . Cancer Brother     SKIN  . Cancer Maternal Grandmother     OVARIAN  . Breast cancer Paternal Grandmother     History  Substance Use Topics  . Smoking status: Never Smoker   . Smokeless tobacco: Never Used  . Alcohol Use: Yes     Comment: HOLIDAYS    OB History   Grav Para Term Preterm Abortions TAB SAB Ect Mult Living   5 2  2 3  3   2       Review of Systems  Cardiovascular: Positive for chest pain.  All other systems reviewed and are negative.    Allergies  Other and Percocet  Home  Medications   Current Outpatient Rx  Name  Route  Sig  Dispense  Refill  . ALPRAZolam (XANAX) 0.5 MG tablet   Oral   Take 0.5 mg by mouth 2 (two) times daily as needed for anxiety.         Marland Kitchen aspirin EC 81 MG tablet   Oral   Take 81 mg by mouth daily.         . colchicine 0.6 MG tablet   Oral   Take 0.6 mg by mouth 2 (two) times daily.         . furosemide (LASIX) 40 MG tablet   Oral   Take 40 mg by mouth 2 (two) times daily.           Marland Kitchen levothyroxine (SYNTHROID, LEVOTHROID) 50 MCG tablet   Oral   Take 50 mcg by mouth daily.         . Multiple Vitamin (MULTIVITAMIN) capsule   Oral   Take 1 capsule by mouth daily.           . ondansetron (ZOFRAN) 8 MG tablet   Oral   Take 8 mg by mouth every 8 (eight) hours as needed for nausea.  BP 103/71  Temp(Src) 98.4 F (36.9 C) (Oral)  Resp 20  SpO2 98%  LMP 04/13/2012  Physical Exam  Constitutional: She is oriented to person, place, and time. She appears well-developed and well-nourished.  HENT:  Head: Normocephalic and atraumatic.  Eyes: Conjunctivae and EOM are normal. Pupils are equal, round, and reactive to light.  Neck: Normal range of motion.  Cardiovascular: Normal rate, regular rhythm and normal heart sounds.   Pulmonary/Chest: Effort normal and breath sounds normal.  Ppm to left chest wall  Abdominal: Soft. Bowel sounds are normal.  Musculoskeletal: Normal range of motion.  Neurological: She is alert and oriented to person, place, and time.  Skin: Skin is warm and dry.  Psychiatric: She has a normal mood and affect. Her behavior is normal.    ED Course  Procedures (including critical care time)  Labs Reviewed  CBC WITH DIFFERENTIAL  COMPREHENSIVE METABOLIC PANEL  PRO B NATRIURETIC PEPTIDE  D-DIMER, QUANTITATIVE  PROTIME-INR  APTT   No results found.   No diagnosis found.    MDM  + constant chest wall pain at sight of ppm.  Worse with breathing.  Nausea. Will analgesia,   Lab,  reassess   Improved.  Ce, d-dimer neg.  cxr possible bronchitis.  No cardiomegaly.  Pt states that feels similar to pericarditis.  Will trial NSAID,  Dc to fu with appt with pmd on fri,  Ret new/worsening sxs     Ilijah Doucet Lytle Michaels, MD 12/05/12 2313  Peggyann Zwiefelhofer Lytle Michaels, MD 12/06/12 [phone]

## 2012-12-05 NOTE — ED Notes (Addendum)
Pt took 81 mg aspirin at 0900 today. Refusing nitro at this time due to it causing her a HA. AO x4. Pt reports continued nausea. Pt reports CP is significantly worse when laying flat, as well as the SOB.

## 2012-12-06 LAB — COMPREHENSIVE METABOLIC PANEL
AST: 27 U/L (ref 0–37)
BUN: 13 mg/dL (ref 6–23)
CO2: 20 mEq/L (ref 19–32)
Calcium: 9 mg/dL (ref 8.4–10.5)
Chloride: 101 mEq/L (ref 96–112)
Creatinine, Ser: 0.7 mg/dL (ref 0.50–1.10)
GFR calc Af Amer: >90 mL/min (ref >90)
GFR calc non Af Amer: >90 mL/min (ref >90)
Glucose, Bld: 129 mg/dL — ABNORMAL HIGH (ref 70–99)
Total Bilirubin: 0.7 mg/dL (ref 0.3–1.2)

## 2012-12-06 LAB — PRO B NATRIURETIC PEPTIDE: Pro B Natriuretic peptide (BNP): 183.8 pg/mL — ABNORMAL HIGH (ref 0–125)

## 2012-12-06 MED ORDER — LEVOFLOXACIN 500 MG PO TABS: 500.0000 mg | ORAL_TABLET | Freq: Every day | ORAL | Status: DC

## 2012-12-06 MED ORDER — PROMETHAZINE HCL 25 MG RE SUPP: 25.0000 mg | Freq: Four times a day (QID) | RECTAL | Status: DC | PRN

## 2012-12-06 MED ORDER — NAPROXEN 375 MG PO TABS: 375.0000 mg | ORAL_TABLET | Freq: Two times a day (BID) | ORAL | Status: DC

## 2012-12-06 MED ORDER — LEVOFLOXACIN 750 MG PO TABS
750.0000 mg | ORAL_TABLET | Freq: Once | ORAL | Status: AC
  Administered 2012-12-06: 750 mg via ORAL
  Filled 2012-12-06: qty 1

## 2012-12-06 MED ORDER — KETOROLAC TROMETHAMINE 30 MG/ML IJ SOLN
30.0000 mg | Freq: Once | INTRAMUSCULAR | Status: AC
  Administered 2012-12-06: 30 mg via INTRAVENOUS
  Filled 2012-12-06: qty 1

## 2013-03-14 ENCOUNTER — Other Ambulatory Visit: Payer: Self-pay | Admitting: Gynecology

## 2013-03-14 NOTE — Telephone Encounter (Signed)
Pt informed KW 

## 2013-03-14 NOTE — Telephone Encounter (Signed)
Needs RGCE next month

## 2013-04-05 DIAGNOSIS — I4719 Other supraventricular tachycardia: Secondary | ICD-10-CM | POA: Insufficient documentation

## 2013-04-05 DIAGNOSIS — I442 Atrioventricular block, complete: Secondary | ICD-10-CM | POA: Insufficient documentation

## 2013-10-14 ENCOUNTER — Other Ambulatory Visit: Payer: Self-pay | Admitting: Gynecology

## 2013-10-15 MED ORDER — ALPRAZOLAM 0.5 MG PO TABS: 0.5000 mg | ORAL_TABLET | Freq: Every evening | ORAL | Status: AC | PRN

## 2013-10-15 NOTE — Telephone Encounter (Signed)
Rx called in KW 

## 2013-10-15 NOTE — Addendum Note (Signed)
Addended by: Richardson Chiquito on: 10/15/2013 10:54 AM   Modules accepted: Orders

## 2014-08-12 ENCOUNTER — Ambulatory Visit (INDEPENDENT_AMBULATORY_CARE_PROVIDER_SITE_OTHER): Payer: BC Managed Care – PPO | Admitting: Gynecology

## 2014-08-12 ENCOUNTER — Encounter: Payer: Self-pay | Admitting: Gynecology

## 2014-08-12 ENCOUNTER — Other Ambulatory Visit (HOSPITAL_COMMUNITY)
Admission: RE | Admit: 2014-08-12 | Discharge: 2014-08-12 | Disposition: A | Discharge status: Home / Self Care | Payer: BC Managed Care – PPO | Source: Ambulatory Visit | Attending: Gynecology | Admitting: Gynecology

## 2014-08-12 VITALS — BP 122/78 | Ht 63.0 in | Wt 212.0 lb

## 2014-08-12 DIAGNOSIS — Z01419 Encounter for gynecological examination (general) (routine) without abnormal findings: Secondary | ICD-10-CM | POA: Diagnosis present

## 2014-08-12 DIAGNOSIS — Z1151 Encounter for screening for human papillomavirus (HPV): Secondary | ICD-10-CM | POA: Insufficient documentation

## 2014-08-12 NOTE — Progress Notes (Signed)
[name] [birth year] [id number]   History:    38 y.o.  for annual gyn exam with no complaints today. Patient in 2013 total laparoscopic hysterectomy with bilateral salpingectomy and appendectomy at [facility] as a result of dysfunction uterine bleeding pelvic pain. She has also been followed by the cardiology team there is a result of her history of ventricular tachycardia and atrial flutter she currently has a pacemaker. Patient many years mild dysplasia  for many years prior to her hysterectomy her Pap smears have been normal.  Past medical history,surgical history, family history and social history were all reviewed and documented in the EPIC chart.  Gynecologic History Patient's last menstrual period was 04/13/2012. Contraception: status post hysterectomy Last Pap: 2011. Results were: normal Last mammogram: 2000. Results were: normal  Obstetric History OB History  Gravida Para Term Preterm AB SAB TAB Ectopic Multiple Living  5 2  2 3 3    2     # Outcome Date GA Lbr Len/2nd Weight Sex Delivery Anes PTL Lv  5 SAB           4 SAB           3 SAB           2 PRE     F CS  Y Y  1 PRE     M CS  Y Y       ROS: A ROS was performed and pertinent positives and negatives are included in the history.  GENERAL: No fevers or chills. HEENT: No change in vision, no earache, sore throat or sinus congestion. NECK: No pain or stiffness. CARDIOVASCULAR: No chest pain or pressure. No palpitations. PULMONARY: No shortness of breath, cough or wheeze. GASTROINTESTINAL: No abdominal pain, nausea, vomiting or diarrhea, melena or bright red blood per rectum. GENITOURINARY: No urinary frequency, urgency, hesitancy or dysuria. MUSCULOSKELETAL: No joint or muscle pain, no back pain, no recent trauma. DERMATOLOGIC: No rash, no itching, no lesions. ENDOCRINE: No polyuria, polydipsia, no heat or cold intolerance. No recent change in weight. HEMATOLOGICAL: No anemia or easy bruising or bleeding.  NEUROLOGIC: No headache, seizures, numbness, tingling or weakness. PSYCHIATRIC: No depression, no loss of interest in normal activity or change in sleep pattern.     Exam: chaperone present  BP 122/78  Ht 5\' 3"  (1.6 m)  Wt 212 lb (96.163 kg)  BMI 37.56 kg/m2  LMP 04/13/2012  Body mass index is 37.56 kg/(m^2).  General appearance : Well developed well nourished female. No acute distress HEENT: Neck supple, trachea midline, no carotid bruits, no thyroidmegaly Lungs: Clear to auscultation, no rhonchi or wheezes, or rib retractions  Heart: Regular rate and rhythm, no murmurs or gallops Breast:Examined in sitting and supine position were symmetrical in appearance, no palpable masses or tenderness,  no skin retraction, no nipple inversion, no nipple discharge, no skin discoloration, no axillary or supraclavicular lymphadenopathy Abdomen: no palpable masses or tenderness, no rebound or guarding Extremities: no edema or skin discoloration or tenderness  Pelvic:  Bartholin, Urethra, Skene Glands: Within normal limits             Vagina: No gross lesions or discharge  Cervix: Absent  Uterus absent  Adnexa  Without masses or tenderness  Anus and perineum  normal   Rectovaginal  normal sphincter tone without palpated masses or tenderness             Hemoccult not indicated     Assessment/Plan:  38 y.o.  female for annual exam doing well. She will continue to followup with her cardiologist on a regular basis. Her PCP is doing her blood work. Patient received her flu vaccine a few weeks ago.  A Pap smear was done today and will be the last one so that we can adhere to the new guidelines. Patient was reminded to do her monthly breast exams.   Terrance Mass MD, 8:41 AM 08/12/2014

## 2014-08-13 LAB — CYTOLOGY - PAP

## 2014-08-25 ENCOUNTER — Encounter: Payer: Self-pay | Admitting: Gynecology

## 2015-02-04 ENCOUNTER — Encounter: Payer: Self-pay | Admitting: Gynecology

## 2015-02-12 ENCOUNTER — Encounter (HOSPITAL_COMMUNITY): Payer: Self-pay

## 2015-02-12 ENCOUNTER — Emergency Department (HOSPITAL_COMMUNITY)
Admission: EM | Admit: 2015-02-12 | Discharge: 2015-02-13 | Disposition: A | Discharge status: Home / Self Care | Payer: BLUE CROSS/BLUE SHIELD | Attending: Emergency Medicine | Admitting: Emergency Medicine

## 2015-02-12 DIAGNOSIS — Y999 Unspecified external cause status: Secondary | ICD-10-CM | POA: Diagnosis not present

## 2015-02-12 DIAGNOSIS — R079 Chest pain, unspecified: Secondary | ICD-10-CM

## 2015-02-12 DIAGNOSIS — Z79899 Other long term (current) drug therapy: Secondary | ICD-10-CM | POA: Insufficient documentation

## 2015-02-12 DIAGNOSIS — Z23 Encounter for immunization: Secondary | ICD-10-CM | POA: Insufficient documentation

## 2015-02-12 DIAGNOSIS — S0191XA Laceration without foreign body of unspecified part of head, initial encounter: Secondary | ICD-10-CM

## 2015-02-12 DIAGNOSIS — R0602 Shortness of breath: Secondary | ICD-10-CM | POA: Diagnosis not present

## 2015-02-12 DIAGNOSIS — W228XXA Striking against or struck by other objects, initial encounter: Secondary | ICD-10-CM | POA: Insufficient documentation

## 2015-02-12 DIAGNOSIS — S0101XA Laceration without foreign body of scalp, initial encounter: Secondary | ICD-10-CM | POA: Diagnosis not present

## 2015-02-12 DIAGNOSIS — Z8619 Personal history of other infectious and parasitic diseases: Secondary | ICD-10-CM | POA: Insufficient documentation

## 2015-02-12 DIAGNOSIS — E039 Hypothyroidism, unspecified: Secondary | ICD-10-CM | POA: Diagnosis not present

## 2015-02-12 DIAGNOSIS — R61 Generalized hyperhidrosis: Secondary | ICD-10-CM | POA: Diagnosis not present

## 2015-02-12 DIAGNOSIS — Z8679 Personal history of other diseases of the circulatory system: Secondary | ICD-10-CM | POA: Diagnosis not present

## 2015-02-12 DIAGNOSIS — Y929 Unspecified place or not applicable: Secondary | ICD-10-CM | POA: Insufficient documentation

## 2015-02-12 DIAGNOSIS — Z791 Long term (current) use of non-steroidal anti-inflammatories (NSAID): Secondary | ICD-10-CM | POA: Diagnosis not present

## 2015-02-12 DIAGNOSIS — R1110 Vomiting, unspecified: Secondary | ICD-10-CM | POA: Diagnosis not present

## 2015-02-12 DIAGNOSIS — Y939 Activity, unspecified: Secondary | ICD-10-CM | POA: Diagnosis not present

## 2015-02-12 DIAGNOSIS — S29001A Unspecified injury of muscle and tendon of front wall of thorax, initial encounter: Secondary | ICD-10-CM | POA: Insufficient documentation

## 2015-02-12 DIAGNOSIS — Z950 Presence of cardiac pacemaker: Secondary | ICD-10-CM | POA: Diagnosis not present

## 2015-02-12 DIAGNOSIS — S0990XA Unspecified injury of head, initial encounter: Secondary | ICD-10-CM | POA: Diagnosis present

## 2015-02-12 MED ORDER — TETANUS-DIPHTH-ACELL PERTUSSIS 5-2.5-18.5 LF-MCG/0.5 IM SUSP
0.5000 mL | Freq: Once | INTRAMUSCULAR | Status: AC
  Administered 2015-02-13: 0.5 mL via INTRAMUSCULAR
  Filled 2015-02-12: qty 0.5
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
## 2015-02-13 ENCOUNTER — Emergency Department (HOSPITAL_COMMUNITY): Payer: BLUE CROSS/BLUE SHIELD

## 2015-02-13 LAB — CBC WITH DIFFERENTIAL/PLATELET
Basophils Absolute: 0 10*3/uL (ref 0.0–0.1)
Basophils Relative: 0 % (ref 0–1)
EOS PCT: 1 % (ref 0–5)
Eosinophils Absolute: 0.2 10*3/uL (ref 0.0–0.7)
HEMATOCRIT: 42.1 % (ref 36.0–46.0)
Hemoglobin: 14.3 g/dL (ref 12.0–15.0)
LYMPHS ABS: 3.4 10*3/uL (ref 0.7–4.0)
LYMPHS PCT: 27 % (ref 12–46)
MCH: 29 pg (ref 26.0–34.0)
MCHC: 34 g/dL (ref 30.0–36.0)
MCV: 85.4 fL (ref 78.0–100.0)
MONO ABS: 1 10*3/uL (ref 0.1–1.0)
MONOS PCT: 8 % (ref 3–12)
Neutro Abs: 8.1 10*3/uL — ABNORMAL HIGH (ref 1.7–7.7)
Neutrophils Relative %: 64 % (ref 43–77)
Platelets: 213 10*3/uL (ref 150–400)
RBC: 4.93 MIL/uL (ref 3.87–5.11)
RDW: 13.4 % (ref 11.5–15.5)
WBC: 12.7 10*3/uL — AB (ref 4.0–10.5)

## 2015-02-13 LAB — BASIC METABOLIC PANEL
ANION GAP: 9 (ref 5–15)
BUN: 13 mg/dL (ref 6–23)
CHLORIDE: 104 mmol/L (ref 96–112)
CO2: 25 mmol/L (ref 19–32)
CREATININE: 1.01 mg/dL (ref 0.50–1.10)
Calcium: 8.8 mg/dL (ref 8.4–10.5)
GFR calc non Af Amer: 69 mL/min — ABNORMAL LOW (ref >90)
GFR, EST AFRICAN AMERICAN: 80 mL/min — AB (ref >90)
Glucose, Bld: 97 mg/dL (ref 70–99)
POTASSIUM: 3.5 mmol/L (ref 3.5–5.1)
SODIUM: 138 mmol/L (ref 135–145)

## 2015-02-13 LAB — I-STAT TROPONIN, ED
TROPONIN I, POC: 0 ng/mL (ref 0.00–0.08)
Troponin i, poc: 0 ng/mL (ref 0.00–0.08)

## 2015-02-13 MED ORDER — IBUPROFEN 800 MG PO TABS
800.0000 mg | ORAL_TABLET | Freq: Once | ORAL | Status: AC
  Administered 2015-02-13: 800 mg via ORAL
  Filled 2015-02-13: qty 1

## 2015-02-13 NOTE — ED Notes (Signed)
Pt alert, oriented, and ambulatory upon DC. She was advised to follow up with PCP.

## 2015-02-13 NOTE — ED Notes (Signed)
Pt ambulated to x-ray with x-ray technician

## 2015-02-13 NOTE — ED Notes (Signed)
[name] with TTS is with patient discussing her sons care.

## 2015-02-13 NOTE — Discharge Instructions (Signed)
Chest Pain (Nonspecific) [name], see a primary care physician within 3 days for follow-up of your chest pain and for a wound check. If you have any concerns of infection come back to emergency department immediately. Thank you. It is often hard to give a diagnosis for the cause of chest pain. There is always a chance that your pain could be related to something serious, such as a heart attack or a blood clot in the lungs. You need to follow up with your doctor. HOME CARE  If antibiotic medicine was given, take it as directed by your doctor. Finish the medicine even if you start to feel better.  For the next few days, avoid activities that bring on chest pain. Continue physical activities as told by your doctor.  Do not use any tobacco products. This includes cigarettes, chewing tobacco, and e-cigarettes.  Avoid drinking alcohol.  Only take medicine as told by your doctor.  Follow your doctor's suggestions for more testing if your chest pain does not go away.  Keep all doctor visits you made. GET HELP IF:  Your chest pain does not go away, even after treatment.  You have a rash with blisters on your chest.  You have a fever. GET HELP RIGHT AWAY IF:   You have more pain or pain that spreads to your arm, neck, jaw, back, or belly (abdomen).  You have shortness of breath.  You cough more than usual or cough up blood.  You have very bad back or belly pain.  You feel sick to your stomach (nauseous) or throw up (vomit).  You have very bad weakness.  You pass out (faint).  You have chills. This is an emergency. Do not wait to see if the problems will go away. Call your local emergency services (911 in [place] Do not drive yourself to the hospital. MAKE SURE YOU:   Understand these instructions.  Will watch your condition.  Will get help right away if you are not doing well or get worse. Document Released: 03/28/2008 Document Revised: 10/15/2013 Document Reviewed:  03/28/2008 [facility] Patient Information 2015 [place], [place]. This information is not intended to replace advice given to you by your health care provider. Make sure you discuss any questions you have with your health care provider. Laceration Care, Adult A laceration is a cut that goes through all layers of the skin. The cut goes into the tissue beneath the skin. HOME CARE For stitches (sutures) or staples:  Keep the cut clean and dry.  If you have a bandage (dressing), change it at least once a day. Change the bandage if it gets wet or dirty, or as told by your doctor.  Wash the cut with soap and water 2 times a day. Rinse the cut with water. Pat it dry with a clean towel.  Put a thin layer of medicated cream on the cut as told by your doctor.  You may shower after the first 24 hours. Do not soak the cut in water until the stitches are removed.  Only take medicines as told by your doctor.  Have your stitches or staples removed as told by your doctor. For skin adhesive strips:  Keep the cut clean and dry.  Do not get the strips wet. You may take a bath, but be careful to keep the cut dry.  If the cut gets wet, pat it dry with a clean towel.  The strips will fall off on their own. Do not remove the strips that  are still stuck to the cut. For wound glue:  You may shower or take baths. Do not soak or scrub the cut. Do not swim. Avoid heavy sweating until the glue falls off on its own. After a shower or bath, pat the cut dry with a clean towel.  Do not put medicine on your cut until the glue falls off.  If you have a bandage, do not put tape over the glue.  Avoid lots of sunlight or tanning lamps until the glue falls off. Put sunscreen on the cut for the first year to reduce your scar.  The glue will fall off on its own. Do not pick at the glue. You may need a tetanus shot if:  You cannot remember when you had your last tetanus shot.  You have never had a tetanus shot. If  you need a tetanus shot and you choose not to have one, you may get tetanus. Sickness from tetanus can be serious. GET HELP RIGHT AWAY IF:   Your pain does not get better with medicine.  Your arm, hand, leg, or foot loses feeling (numbness) or changes color.  Your cut is bleeding.  Your joint feels weak, or you cannot use your joint.  You have painful lumps on your body.  Your cut is red, puffy (swollen), or painful.  You have a red line on the skin near the cut.  You have yellowish-white fluid (pus) coming from the cut.  You have a fever.  You have a bad smell coming from the cut or bandage.  Your cut breaks open before or after stitches are removed.  You notice something coming out of the cut, such as wood or glass.  You cannot move a finger or toe. MAKE SURE YOU:   Understand these instructions.  Will watch your condition.  Will get help right away if you are not doing well or get worse. Document Released: 03/28/2008 Document Revised: 01/02/2012 Document Reviewed: 04/05/2011 [facility] Patient Information 2015 [place], [place]. This information is not intended to replace advice given to you by your health care provider. Make sure you discuss any questions you have with your health care provider.
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
## 2016-02-12 DIAGNOSIS — H40013 Open angle with borderline findings, low risk, bilateral: Secondary | ICD-10-CM | POA: Diagnosis not present

## 2016-02-23 ENCOUNTER — Other Ambulatory Visit: Payer: Self-pay

## 2016-02-23 DIAGNOSIS — N6320 Unspecified lump in the left breast, unspecified quadrant: Secondary | ICD-10-CM

## 2016-04-08 DIAGNOSIS — Z1321 Encounter for screening for nutritional disorder: Secondary | ICD-10-CM | POA: Diagnosis not present

## 2016-04-08 DIAGNOSIS — Z0001 Encounter for general adult medical examination with abnormal findings: Secondary | ICD-10-CM | POA: Diagnosis not present

## 2016-04-08 DIAGNOSIS — Z131 Encounter for screening for diabetes mellitus: Secondary | ICD-10-CM | POA: Diagnosis not present

## 2016-04-08 DIAGNOSIS — R112 Nausea with vomiting, unspecified: Secondary | ICD-10-CM | POA: Diagnosis not present

## 2016-04-08 DIAGNOSIS — N390 Urinary tract infection, site not specified: Secondary | ICD-10-CM | POA: Diagnosis not present

## 2016-04-08 DIAGNOSIS — Z13220 Encounter for screening for lipoid disorders: Secondary | ICD-10-CM | POA: Diagnosis not present

## 2016-04-08 DIAGNOSIS — E039 Hypothyroidism, unspecified: Secondary | ICD-10-CM | POA: Diagnosis not present

## 2016-04-12 DIAGNOSIS — I442 Atrioventricular block, complete: Secondary | ICD-10-CM | POA: Diagnosis not present

## 2016-04-13 ENCOUNTER — Other Ambulatory Visit: Payer: Self-pay | Admitting: Internal Medicine

## 2016-04-13 DIAGNOSIS — Z1231 Encounter for screening mammogram for malignant neoplasm of breast: Secondary | ICD-10-CM

## 2016-04-13 DIAGNOSIS — N6320 Unspecified lump in the left breast, unspecified quadrant: Secondary | ICD-10-CM

## 2016-04-15 ENCOUNTER — Ambulatory Visit
Admission: RE | Admit: 2016-04-15 | Discharge: 2016-04-15 | Disposition: A | Discharge status: Home / Self Care | Payer: BLUE CROSS/BLUE SHIELD | Source: Ambulatory Visit | Attending: Internal Medicine | Admitting: Internal Medicine

## 2016-04-15 ENCOUNTER — Other Ambulatory Visit: Payer: Self-pay | Admitting: Internal Medicine

## 2016-04-15 DIAGNOSIS — R112 Nausea with vomiting, unspecified: Secondary | ICD-10-CM | POA: Diagnosis not present

## 2016-04-15 DIAGNOSIS — R519 Headache, unspecified: Secondary | ICD-10-CM

## 2016-04-15 DIAGNOSIS — R51 Headache: Principal | ICD-10-CM

## 2016-04-20 DIAGNOSIS — Z0000 Encounter for general adult medical examination without abnormal findings: Secondary | ICD-10-CM | POA: Diagnosis not present

## 2016-04-22 DIAGNOSIS — F419 Anxiety disorder, unspecified: Secondary | ICD-10-CM | POA: Diagnosis not present

## 2016-04-22 DIAGNOSIS — G43109 Migraine with aura, not intractable, without status migrainosus: Secondary | ICD-10-CM | POA: Diagnosis not present

## 2016-04-27 ENCOUNTER — Ambulatory Visit
Admission: RE | Admit: 2016-04-27 | Discharge: 2016-04-27 | Disposition: A | Discharge status: Home / Self Care | Payer: BLUE CROSS/BLUE SHIELD | Source: Ambulatory Visit | Attending: Internal Medicine | Admitting: Internal Medicine

## 2016-04-27 ENCOUNTER — Other Ambulatory Visit: Payer: Self-pay | Admitting: Internal Medicine

## 2016-04-27 ENCOUNTER — Ambulatory Visit: Payer: BLUE CROSS/BLUE SHIELD

## 2016-04-27 DIAGNOSIS — N6320 Unspecified lump in the left breast, unspecified quadrant: Secondary | ICD-10-CM

## 2016-04-27 DIAGNOSIS — N63 Unspecified lump in breast: Secondary | ICD-10-CM | POA: Diagnosis not present

## 2016-04-27 DIAGNOSIS — N6310 Unspecified lump in the right breast, unspecified quadrant: Secondary | ICD-10-CM

## 2016-04-28 ENCOUNTER — Other Ambulatory Visit: Payer: Self-pay | Admitting: Internal Medicine

## 2016-04-28 DIAGNOSIS — N6310 Unspecified lump in the right breast, unspecified quadrant: Secondary | ICD-10-CM

## 2016-04-29 ENCOUNTER — Ambulatory Visit
Admission: RE | Admit: 2016-04-29 | Discharge: 2016-04-29 | Disposition: A | Discharge status: Home / Self Care | Payer: BLUE CROSS/BLUE SHIELD | Source: Ambulatory Visit | Attending: Internal Medicine | Admitting: Internal Medicine

## 2016-04-29 DIAGNOSIS — N6310 Unspecified lump in the right breast, unspecified quadrant: Secondary | ICD-10-CM

## 2016-04-29 DIAGNOSIS — N63 Unspecified lump in breast: Secondary | ICD-10-CM | POA: Diagnosis not present

## 2016-04-29 DIAGNOSIS — D241 Benign neoplasm of right breast: Secondary | ICD-10-CM | POA: Diagnosis not present

## 2016-05-12 DIAGNOSIS — F419 Anxiety disorder, unspecified: Secondary | ICD-10-CM | POA: Diagnosis not present

## 2016-05-13 ENCOUNTER — Ambulatory Visit: Payer: Self-pay | Admitting: Surgery

## 2016-05-13 DIAGNOSIS — D241 Benign neoplasm of right breast: Secondary | ICD-10-CM

## 2016-05-13 NOTE — H&P (Signed)
[name] 05/13/2016 10:24 AM Location: [facility] Patient #: [id number] DOB: [birth year] Married / Language: Cleophus Molt / Race: White Female  History of Present Illness Marcello Moores A. Alasia Enge MD; 05/13/2016 12:42 PM) Patient words: Patient sent at the request of Dr. Mariea Clonts for right breast fibroadenoma. She underwent screening mammogram. This discomfort involving her left breast. Ultrasound revealed a mass in the right breast upper outer quadrant core biopsy proven to be fibroadenoma. It measures 7 mm in maximal diameter. She desires excision since she has had previous breast reduction and is uncomfortable leaving the mass in her right breast. Denies any right breast pain, mass or discomfort. She does have some mild discomfort in her left axilla.                        CLINICAL DATA: Patient presents for focal tenderness and palpable abnormality within the upper-outer left breast.  EXAM: 2D DIGITAL DIAGNOSTIC BILATERAL MAMMOGRAM WITH CAD AND ADJUNCT TOMO  ULTRASOUND BILATERAL BREAST  COMPARISON: Previous exam(s).  ACR Breast Density Category c: The breast tissue is heterogeneously dense, which may obscure small masses.  FINDINGS: Within the lower outer right breast there is a 9 mm lobular mass. Postreduction changes identified within the right and left breast. No additional concerning masses, calcifications or architectural distortion identified within either breast.  Mammographic images were processed with CAD.  On physical exam, I palpate no discrete mass within the upper-outer left breast or lower outer right breast.  Targeted ultrasound is performed, showing a lobular 9 x 6 x 9 mm hypoechoic mass within the right breast 7 o'clock position 8 cm from the nipple. There is peripheral color vascular flow. No right axillary lymphadenopathy.  Palpable abnormality within the left breast 1 o'clock position 7 cm from the nipple corresponds  with pacer wires.  IMPRESSION: Indeterminate hypoechoic right breast mass 7 o'clock position.  RECOMMENDATION: Ultrasound-guided core needle biopsy indeterminate right breast mass.  I have discussed the findings and recommendations with the patient. Results were also provided in writing at the conclusion of the visit. If applicable, a reminder letter will be sent to the patient regarding the next appointment.  BI-RADS CATEGORY 4: Suspicious.   Electronically Signed By: Lovey Newcomer M.D. On: 04/27/2016 16:43     CLINICAL DATA: Status post ultrasound-guided core needle biopsy of a 9 mm mass in the 7 o'clock position of the right breast.  EXAM: DIAGNOSTIC RIGHT MAMMOGRAM POST ULTRASOUND BIOPSY  COMPARISON: Previous exam(s).  FINDINGS: Mammographic images were obtained following ultrasound guided biopsy of the recently demonstrated 9 mm mass in the 7 o'clock position of the right breast. These demonstrate a ribbon shaped biopsy marker clip at the location of the biopsied mass.  IMPRESSION: Appropriate clip deployment following right breast ultrasound-guided core needle biopsy.  Final Assessment: Post Procedure Mammograms for Marker Placement   Electronically Signed By: Claudie Revering M.D. On: 04/29/2016 17:05        Diagnosis Breast, right, needle core biopsy, 7 o'clock - FIBROADENOMA. - THERE IS NO EVIDENCE OF MALIGNANCY. - SEE COMMENT.  The patient is a 40 year old female.   Other Problems Elbert Ewings, CMA; 05/13/2016 10:24 AM) Other disease, cancer, significant illness  Past Surgical History Elbert Ewings, CMA; 05/13/2016 10:24 AM) Gallbladder Surgery - Laparoscopic Hysterectomy (not due to cancer) - Partial Mammoplasty; Reduction Bilateral. Oral Surgery  Diagnostic Studies History Elbert Ewings, [place]; 05/13/2016 10:24 AM) Colonoscopy never Mammogram 1-3 years ago  Allergies Elbert Ewings, [place]; 05/13/2016  10:25 AM) Steri-Strip *MEDICAL  DEVICES AND SUPPLIES* Rash. Oxycodone-Acetaminophen *ANALGESICS - OPIOID*  Medication History Elbert Ewings, CMA; 05/13/2016 10:27 AM) ALPRAZolam (0.5MG  Tablet, Oral) Active. Furosemide (40MG  Tablet, Oral) Active. Levothyroxine Sodium (50MCG Tablet, Oral) Active. Ondansetron HCl (8MG  Tablet, Oral) Active. Sertraline HCl (25MG  Tablet, Oral) Active. Topiramate (50MG  Tablet, Oral) Active. Multiple Vitamin (Oral) Active. PriLOSEC (20MG  Capsule DR, Oral) Active. Vitamin D (Cholecalciferol) (1000UNIT Tablet, Oral) Active. Magnesium (100MG  Tablet, Oral) Active. Medications Reconciled  Social History Elbert Ewings, [place]; 05/13/2016 10:24 AM) Alcohol use Occasional alcohol use. Caffeine use Tea. No drug use Tobacco use Never smoker.  Family History Elbert Ewings, [place]; 05/13/2016 10:24 AM) Depression Sister. Heart Disease Father. Hypertension Father.  Pregnancy / Birth History Elbert Ewings, CMA; 05/13/2016 10:24 AM) Age at menarche 50 years. Age of menopause <44 Gravida 5 Irregular periods Maternal age 24-25 Para 2     Review of Systems Elbert Ewings CMA; 05/13/2016 10:24 AM) General Not Present- Appetite Loss, Chills, Fatigue, Fever, Night Sweats, Weight Gain and Weight Loss. Skin Not Present- Change in Wart/Mole, Dryness, Hives, Jaundice, New Lesions, Non-Healing Wounds, Rash and Ulcer. HEENT Not Present- Earache, Hearing Loss, Hoarseness, Nose Bleed, Oral Ulcers, Ringing in the Ears, Seasonal Allergies, Sinus Pain, Sore Throat, Visual Disturbances, Wears glasses/contact lenses and Yellow Eyes. Respiratory Not Present- Bloody sputum, Chronic Cough, Difficulty Breathing, Snoring and Wheezing. Breast Present- Breast Mass. Not Present- Breast Pain, Nipple Discharge and Skin Changes. Cardiovascular Not Present- Chest Pain, Difficulty Breathing Lying Down, Leg Cramps, Palpitations, Rapid Heart Rate, Shortness of Breath and Swelling of Extremities. Gastrointestinal Not  Present- Abdominal Pain, Bloating, Bloody Stool, Change in Bowel Habits, Chronic diarrhea, Constipation, Difficulty Swallowing, Excessive gas, Gets full quickly at meals, Hemorrhoids, Indigestion, Nausea, Rectal Pain and Vomiting. Female Genitourinary Not Present- Frequency, Nocturia, Painful Urination, Pelvic Pain and Urgency. Musculoskeletal Not Present- Back Pain, Joint Pain, Joint Stiffness, Muscle Pain, Muscle Weakness and Swelling of Extremities. Neurological Not Present- Decreased Memory, Fainting, Headaches, Numbness, Seizures, Tingling, Tremor, Trouble walking and Weakness. Endocrine Not Present- Cold Intolerance, Excessive Hunger, Hair Changes, Heat Intolerance, Hot flashes and New Diabetes. Hematology Not Present- Blood Thinners, Easy Bruising, Excessive bleeding, Gland problems, HIV and Persistent Infections.  Vitals Elbert Ewings CMA; 05/13/2016 10:27 AM) 05/13/2016 10:27 AM Weight: 207.6 lb Height: 64in Body Surface Area: 1.99 m Body Mass Index: 35.63 kg/m  Temp.: 98.41F(Temporal)  Pulse: 92 (Regular)  BP: 126/72 (Sitting, Left Arm, Standard)      Physical Exam (Jazline Cumbee A. Marquise Wicke MD; 05/13/2016 12:43 PM)  General Mental Status-Alert. General Appearance-Consistent with stated age. Hydration-Well hydrated. Voice-Normal.  Head and Neck Head-normocephalic, atraumatic with no lesions or palpable masses. Trachea-midline. Thyroid Gland Characteristics - normal size and consistency.  Chest and Lung Exam Chest and lung exam reveals -quiet, even and easy respiratory effort with no use of accessory muscles and on auscultation, normal breath sounds, no adventitious sounds and normal vocal resonance. Inspection Chest Wall - Normal. Back - normal.  Breast Note: Bilateral breast reduction scars noted. Mild fullness right breast upper outer quadrant. Mild ecchymoses inferior mammary fold right breast. Left breast is normal. Normal fibroglandular tissue  left axilla.  Cardiovascular Cardiovascular examination reveals -normal heart sounds, regular rate and rhythm with no murmurs and normal pedal pulses bilaterally.  Neurologic Neurologic evaluation reveals -alert and oriented x 3 with no impairment of recent or remote memory. Mental Status-Normal.  Musculoskeletal Normal Exam - Left-Upper Extremity Strength Normal and Lower Extremity Strength Normal. Normal Exam - Right-Upper Extremity Strength Normal and Lower  Extremity Strength Normal.  Lymphatic Head & Neck  General Head & Neck Lymphatics: Bilateral - Description - Normal. Axillary  General Axillary Region: Bilateral - Description - Normal. Tenderness - Non Tender.    Assessment & Plan (Asaad Gulley A. Cullen Vanallen MD; 05/13/2016 12:43 PM)  BREAST FIBROADENOMA, RIGHT (D24.1) Impression: Patient desires right breast seemed localized lumpectomy for excision of fibroadenoma. Nonoperative options offered as well as observation. Risk of lumpectomy include bleeding, infection, seroma, more surgery, use of seed/wire, wound care, cosmetic deformity and the need for other treatments, death , blood clots, death. Pt agrees to proceed.  Current Plans You are being scheduled for surgery - Our schedulers will call you.  You should hear from our office's scheduling department within 5 working days about the location, date, and time of surgery. We try to make accommodations for patient's preferences in scheduling surgery, but sometimes the OR schedule or the surgeon's schedule prevents [place] from making those accommodations.  If you have not heard from our office [phone]) in 5 working days, call the office and ask for your surgeon's nurse.  If you have other questions about your diagnosis, plan, or surgery, call the office and ask for your surgeon's nurse.  Pt Education - [facility] Breast Biopsy HCI: discussed with patient and provided information. The anatomy and the physiology was discussed. The  pathophysiology and natural history of the disease was discussed. Options were discussed and recommendations were made. Technique, risks, benefits, & alternatives were discussed. Risks such as stroke, heart attack, bleeding, indection, death, and other risks discussed. Questions answered. The patient agrees to proceed.

## 2016-05-18 ENCOUNTER — Other Ambulatory Visit: Payer: Self-pay | Admitting: Surgery

## 2016-05-18 DIAGNOSIS — D241 Benign neoplasm of right breast: Secondary | ICD-10-CM

## 2016-05-26 ENCOUNTER — Encounter (HOSPITAL_BASED_OUTPATIENT_CLINIC_OR_DEPARTMENT_OTHER): Payer: Self-pay | Admitting: *Deleted

## 2016-05-26 NOTE — Progress Notes (Addendum)
Bring all medications. Coming tomorrow for [employer]. Requesting notes from [facility] about  Pacemaker. Pt is 100% paced Dr. Kalman Shan reviewed notes and EKG - ok for surgery but need form from cardiologist filled for device to manage pacemaker. Faxed form to [facility] cardiologist - Dr.Kumar to fill out.

## 2016-05-27 ENCOUNTER — Encounter (HOSPITAL_BASED_OUTPATIENT_CLINIC_OR_DEPARTMENT_OTHER)
Admission: RE | Admit: 2016-05-27 | Discharge: 2016-05-27 | Disposition: A | Discharge status: Home / Self Care | Payer: BLUE CROSS/BLUE SHIELD | Source: Ambulatory Visit | Attending: Surgery | Admitting: Surgery

## 2016-05-27 DIAGNOSIS — Z01812 Encounter for preprocedural laboratory examination: Secondary | ICD-10-CM | POA: Insufficient documentation

## 2016-05-27 LAB — BASIC METABOLIC PANEL
ANION GAP: 7 (ref 5–15)
BUN: 9 mg/dL (ref 6–20)
CALCIUM: 8.6 mg/dL — AB (ref 8.9–10.3)
CO2: 21 mmol/L — ABNORMAL LOW (ref 22–32)
CREATININE: 0.82 mg/dL (ref 0.44–1.00)
Chloride: 110 mmol/L (ref 101–111)
Glucose, Bld: 97 mg/dL (ref 65–99)
Potassium: 4.3 mmol/L (ref 3.5–5.1)
SODIUM: 138 mmol/L (ref 135–145)

## 2016-05-31 ENCOUNTER — Ambulatory Visit
Admission: RE | Admit: 2016-05-31 | Discharge: 2016-05-31 | Disposition: A | Discharge status: Home / Self Care | Payer: BLUE CROSS/BLUE SHIELD | Source: Ambulatory Visit | Attending: Surgery | Admitting: Surgery

## 2016-05-31 DIAGNOSIS — D241 Benign neoplasm of right breast: Secondary | ICD-10-CM | POA: Diagnosis not present

## 2016-06-01 NOTE — Pre-Procedure Instructions (Signed)
Reviewed history and chart with Dr Al Corpus. Ok to proceed

## 2016-06-02 ENCOUNTER — Emergency Department (HOSPITAL_COMMUNITY): Payer: BLUE CROSS/BLUE SHIELD

## 2016-06-02 ENCOUNTER — Encounter (HOSPITAL_BASED_OUTPATIENT_CLINIC_OR_DEPARTMENT_OTHER): Payer: Self-pay | Admitting: *Deleted

## 2016-06-02 ENCOUNTER — Emergency Department (HOSPITAL_COMMUNITY)
Admission: EM | Admit: 2016-06-02 | Discharge: 2016-06-03 | Disposition: A | Discharge status: Home / Self Care | Payer: BLUE CROSS/BLUE SHIELD | Attending: Physician Assistant | Admitting: Physician Assistant

## 2016-06-02 ENCOUNTER — Ambulatory Visit
Admission: RE | Admit: 2016-06-02 | Discharge: 2016-06-02 | Disposition: A | Discharge status: Home / Self Care | Payer: BLUE CROSS/BLUE SHIELD | Source: Ambulatory Visit | Attending: Surgery | Admitting: Surgery

## 2016-06-02 ENCOUNTER — Encounter (HOSPITAL_BASED_OUTPATIENT_CLINIC_OR_DEPARTMENT_OTHER)
Admission: RE | Disposition: A | Discharge status: Home / Self Care | Payer: Self-pay | Source: Ambulatory Visit | Attending: Surgery

## 2016-06-02 ENCOUNTER — Ambulatory Visit (HOSPITAL_BASED_OUTPATIENT_CLINIC_OR_DEPARTMENT_OTHER): Payer: BLUE CROSS/BLUE SHIELD | Admitting: Anesthesiology

## 2016-06-02 ENCOUNTER — Ambulatory Visit (HOSPITAL_BASED_OUTPATIENT_CLINIC_OR_DEPARTMENT_OTHER)
Admission: RE | Admit: 2016-06-02 | Discharge: 2016-06-02 | Disposition: A | Discharge status: Home / Self Care | Payer: BLUE CROSS/BLUE SHIELD | Source: Ambulatory Visit | Attending: Surgery | Admitting: Surgery

## 2016-06-02 DIAGNOSIS — I509 Heart failure, unspecified: Secondary | ICD-10-CM | POA: Insufficient documentation

## 2016-06-02 DIAGNOSIS — F329 Major depressive disorder, single episode, unspecified: Secondary | ICD-10-CM | POA: Insufficient documentation

## 2016-06-02 DIAGNOSIS — D241 Benign neoplasm of right breast: Secondary | ICD-10-CM

## 2016-06-02 DIAGNOSIS — R079 Chest pain, unspecified: Secondary | ICD-10-CM | POA: Diagnosis not present

## 2016-06-02 DIAGNOSIS — E039 Hypothyroidism, unspecified: Secondary | ICD-10-CM | POA: Diagnosis not present

## 2016-06-02 DIAGNOSIS — F419 Anxiety disorder, unspecified: Secondary | ICD-10-CM | POA: Diagnosis not present

## 2016-06-02 DIAGNOSIS — Z950 Presence of cardiac pacemaker: Secondary | ICD-10-CM | POA: Insufficient documentation

## 2016-06-02 DIAGNOSIS — I2699 Other pulmonary embolism without acute cor pulmonale: Secondary | ICD-10-CM

## 2016-06-02 DIAGNOSIS — R072 Precordial pain: Secondary | ICD-10-CM | POA: Diagnosis present

## 2016-06-02 DIAGNOSIS — R0789 Other chest pain: Secondary | ICD-10-CM | POA: Diagnosis not present

## 2016-06-02 HISTORY — DX: Major depressive disorder, single episode, unspecified: F32.9

## 2016-06-02 HISTORY — DX: Headache: R51

## 2016-06-02 HISTORY — DX: Heart failure, unspecified: I50.9

## 2016-06-02 HISTORY — DX: Depression, unspecified: F32.A

## 2016-06-02 HISTORY — PX: BREAST LUMPECTOMY WITH RADIOACTIVE SEED LOCALIZATION: SHX6424

## 2016-06-02 HISTORY — DX: Headache, unspecified: R51.9

## 2016-06-02 HISTORY — DX: Anxiety disorder, unspecified: F41.9

## 2016-06-02 HISTORY — PX: BREAST EXCISIONAL BIOPSY: SUR124

## 2016-06-02 LAB — BASIC METABOLIC PANEL
Anion gap: 8 (ref 5–15)
BUN: 7 mg/dL (ref 6–20)
CHLORIDE: 109 mmol/L (ref 101–111)
CO2: 20 mmol/L — ABNORMAL LOW (ref 22–32)
CREATININE: 0.85 mg/dL (ref 0.44–1.00)
Calcium: 8.2 mg/dL — ABNORMAL LOW (ref 8.9–10.3)
GFR calc Af Amer: >60 mL/min (ref >60)
GFR calc non Af Amer: >60 mL/min (ref >60)
GLUCOSE: 123 mg/dL — AB (ref 65–99)
POTASSIUM: 3.9 mmol/L (ref 3.5–5.1)
Sodium: 137 mmol/L (ref 135–145)

## 2016-06-02 LAB — CBC WITH DIFFERENTIAL/PLATELET
Basophils Absolute: 0 10*3/uL (ref 0.0–0.1)
Basophils Relative: 0 %
EOS PCT: 0 %
Eosinophils Absolute: 0 10*3/uL (ref 0.0–0.7)
HCT: 39.5 % (ref 36.0–46.0)
HEMOGLOBIN: 13.5 g/dL (ref 12.0–15.0)
LYMPHS ABS: 1 10*3/uL (ref 0.7–4.0)
LYMPHS PCT: 8 %
MCH: 29.1 pg (ref 26.0–34.0)
MCHC: 34.2 g/dL (ref 30.0–36.0)
MCV: 85.1 fL (ref 78.0–100.0)
Monocytes Absolute: 0.1 10*3/uL (ref 0.1–1.0)
Monocytes Relative: 1 %
NEUTROS PCT: 91 %
Neutro Abs: 11.6 10*3/uL — ABNORMAL HIGH (ref 1.7–7.7)
Platelets: 186 10*3/uL (ref 150–400)
RBC: 4.64 MIL/uL (ref 3.87–5.11)
RDW: 13.4 % (ref 11.5–15.5)
WBC: 12.7 10*3/uL — AB (ref 4.0–10.5)

## 2016-06-02 LAB — D-DIMER, QUANTITATIVE: D-Dimer, Quant: 0.77 ug/mL-FEU — ABNORMAL HIGH (ref 0.00–0.50)

## 2016-06-02 SURGERY — BREAST LUMPECTOMY WITH RADIOACTIVE SEED LOCALIZATION
Anesthesia: General | Laterality: Right

## 2016-06-02 MED ORDER — SODIUM CHLORIDE 0.9 % IJ SOLN
INTRAMUSCULAR | Status: AC
  Filled 2016-06-02: qty 10

## 2016-06-02 MED ORDER — CEFAZOLIN SODIUM-DEXTROSE 2-4 GM/100ML-% IV SOLN
INTRAVENOUS | Status: AC
  Filled 2016-06-02: qty 100

## 2016-06-02 MED ORDER — FENTANYL CITRATE (PF) 100 MCG/2ML IJ SOLN
50.0000 ug | Freq: Once | INTRAMUSCULAR | Status: AC
  Administered 2016-06-02: 50 ug via INTRAVENOUS
  Filled 2016-06-02: qty 2

## 2016-06-02 MED ORDER — CHLORHEXIDINE GLUCONATE CLOTH 2 % EX PADS: 6.0000 | MEDICATED_PAD | Freq: Once | CUTANEOUS | Status: DC

## 2016-06-02 MED ORDER — ACETAMINOPHEN 500 MG PO TABS
1000.0000 mg | ORAL_TABLET | ORAL | Status: AC
  Administered 2016-06-02: 1000 mg via ORAL

## 2016-06-02 MED ORDER — MIDAZOLAM HCL 2 MG/2ML IJ SOLN
1.0000 mg | INTRAMUSCULAR | Status: DC | PRN
  Administered 2016-06-02: 2 mg via INTRAVENOUS

## 2016-06-02 MED ORDER — LIDOCAINE 2% (20 MG/ML) 5 ML SYRINGE
INTRAMUSCULAR | Status: DC | PRN
  Administered 2016-06-02: 25 mg via INTRAVENOUS

## 2016-06-02 MED ORDER — FENTANYL CITRATE (PF) 100 MCG/2ML IJ SOLN
INTRAMUSCULAR | Status: AC
  Filled 2016-06-02: qty 2

## 2016-06-02 MED ORDER — SCOPOLAMINE 1 MG/3DAYS TD PT72: 1.0000 | MEDICATED_PATCH | Freq: Once | TRANSDERMAL | Status: DC | PRN

## 2016-06-02 MED ORDER — HYDROMORPHONE HCL 1 MG/ML IJ SOLN
INTRAMUSCULAR | Status: AC
  Filled 2016-06-02: qty 1

## 2016-06-02 MED ORDER — DEXTROSE 5 % IV SOLN
3.0000 g | INTRAVENOUS | Status: AC
  Administered 2016-06-02: 2 g via INTRAVENOUS

## 2016-06-02 MED ORDER — PROPOFOL 10 MG/ML IV BOLUS
INTRAVENOUS | Status: AC
  Filled 2016-06-02: qty 20

## 2016-06-02 MED ORDER — NAPROXEN 500 MG PO TABS: 500.0000 mg | ORAL_TABLET | Freq: Two times a day (BID) | ORAL | 0 refills | Status: AC

## 2016-06-02 MED ORDER — HYDROCODONE-ACETAMINOPHEN 7.5-325 MG PO TABS: 1.0000 | ORAL_TABLET | Freq: Once | ORAL | Status: DC | PRN

## 2016-06-02 MED ORDER — ASPIRIN 325 MG PO TABS
325.0000 mg | ORAL_TABLET | Freq: Every day | ORAL | Status: DC
Start: 2016-06-02 — End: 2016-06-02
  Administered 2016-06-02: 325 mg via ORAL

## 2016-06-02 MED ORDER — PROPOFOL 10 MG/ML IV BOLUS
INTRAVENOUS | Status: DC | PRN
  Administered 2016-06-02: 150 mg via INTRAVENOUS

## 2016-06-02 MED ORDER — HYDROMORPHONE HCL 1 MG/ML IJ SOLN
0.2500 mg | INTRAMUSCULAR | Status: DC | PRN
  Administered 2016-06-02 (×4): 0.5 mg via INTRAVENOUS

## 2016-06-02 MED ORDER — DEXAMETHASONE SODIUM PHOSPHATE 4 MG/ML IJ SOLN
INTRAMUSCULAR | Status: DC | PRN
  Administered 2016-06-02: 10 mg via INTRAVENOUS

## 2016-06-02 MED ORDER — LIDOCAINE 2% (20 MG/ML) 5 ML SYRINGE
INTRAMUSCULAR | Status: AC
  Filled 2016-06-02: qty 5

## 2016-06-02 MED ORDER — IOPAMIDOL (ISOVUE-370) INJECTION 76%
INTRAVENOUS | Status: AC
  Administered 2016-06-03: 100 mL
  Filled 2016-06-02: qty 100

## 2016-06-02 MED ORDER — PROMETHAZINE HCL 25 MG/ML IJ SOLN
6.2500 mg | INTRAMUSCULAR | Status: DC | PRN
  Administered 2016-06-02: 6.25 mg via INTRAVENOUS

## 2016-06-02 MED ORDER — KETOROLAC TROMETHAMINE 30 MG/ML IJ SOLN: 30.0000 mg | Freq: Once | INTRAMUSCULAR | Status: DC | PRN

## 2016-06-02 MED ORDER — GABAPENTIN 300 MG PO CAPS
ORAL_CAPSULE | ORAL | Status: AC
  Filled 2016-06-02: qty 1

## 2016-06-02 MED ORDER — BUPIVACAINE-EPINEPHRINE (PF) 0.25% -1:200000 IJ SOLN
INTRAMUSCULAR | Status: DC | PRN
  Administered 2016-06-02: 6 mL via PERINEURAL

## 2016-06-02 MED ORDER — GABAPENTIN 300 MG PO CAPS
300.0000 mg | ORAL_CAPSULE | ORAL | Status: AC
  Administered 2016-06-02: 300 mg via ORAL

## 2016-06-02 MED ORDER — ONDANSETRON HCL 4 MG/2ML IJ SOLN
INTRAMUSCULAR | Status: AC
  Filled 2016-06-02: qty 2

## 2016-06-02 MED ORDER — GLYCOPYRROLATE 0.2 MG/ML IJ SOLN: 0.2000 mg | Freq: Once | INTRAMUSCULAR | Status: DC | PRN

## 2016-06-02 MED ORDER — PROMETHAZINE HCL 25 MG/ML IJ SOLN
INTRAMUSCULAR | Status: AC
  Filled 2016-06-02: qty 1

## 2016-06-02 MED ORDER — LACTATED RINGERS IV SOLN
INTRAVENOUS | Status: DC
  Administered 2016-06-02 (×2): via INTRAVENOUS

## 2016-06-02 MED ORDER — FENTANYL CITRATE (PF) 100 MCG/2ML IJ SOLN
50.0000 ug | Freq: Once | INTRAMUSCULAR | Status: AC
  Administered 2016-06-03: 50 ug via INTRAVENOUS
  Filled 2016-06-02: qty 2

## 2016-06-02 MED ORDER — ONDANSETRON HCL 4 MG/2ML IJ SOLN
4.0000 mg | Freq: Once | INTRAMUSCULAR | Status: AC
  Administered 2016-06-02: 4 mg via INTRAVENOUS
  Filled 2016-06-02: qty 2

## 2016-06-02 MED ORDER — ACETAMINOPHEN 500 MG PO TABS
ORAL_TABLET | ORAL | Status: AC
  Filled 2016-06-02: qty 2

## 2016-06-02 MED ORDER — ONDANSETRON HCL 4 MG/2ML IJ SOLN
INTRAMUSCULAR | Status: DC | PRN
  Administered 2016-06-02: 4 mg via INTRAVENOUS

## 2016-06-02 MED ORDER — CELECOXIB 200 MG PO CAPS
ORAL_CAPSULE | ORAL | Status: AC
  Filled 2016-06-02: qty 2

## 2016-06-02 MED ORDER — BUPIVACAINE-EPINEPHRINE (PF) 0.25% -1:200000 IJ SOLN
INTRAMUSCULAR | Status: AC
  Filled 2016-06-02: qty 30

## 2016-06-02 MED ORDER — CELECOXIB 400 MG PO CAPS
400.0000 mg | ORAL_CAPSULE | ORAL | Status: AC
  Administered 2016-06-02: 400 mg via ORAL

## 2016-06-02 MED ORDER — DEXAMETHASONE SODIUM PHOSPHATE 10 MG/ML IJ SOLN
INTRAMUSCULAR | Status: AC
  Filled 2016-06-02: qty 1

## 2016-06-02 MED ORDER — SODIUM CHLORIDE 0.9 % IV SOLN
INTRAVENOUS | Status: DC
Start: 2016-06-02 — End: 2016-06-03
  Administered 2016-06-02: 19:00:00 via INTRAVENOUS

## 2016-06-02 MED ORDER — FENTANYL CITRATE (PF) 100 MCG/2ML IJ SOLN
50.0000 ug | INTRAMUSCULAR | Status: DC | PRN
  Administered 2016-06-02: 100 ug via INTRAVENOUS

## 2016-06-02 MED ORDER — MIDAZOLAM HCL 2 MG/2ML IJ SOLN
INTRAMUSCULAR | Status: AC
  Filled 2016-06-02: qty 2

## 2016-06-02 SURGICAL SUPPLY — 45 items
APPLIER CLIP 9.375 MED OPEN (MISCELLANEOUS)
BINDER BREAST LRG (GAUZE/BANDAGES/DRESSINGS) IMPLANT
BINDER BREAST MEDIUM (GAUZE/BANDAGES/DRESSINGS) IMPLANT
BINDER BREAST XLRG (GAUZE/BANDAGES/DRESSINGS) ×2 IMPLANT
BINDER BREAST XXLRG (GAUZE/BANDAGES/DRESSINGS) IMPLANT
BLADE SURG 15 STRL LF DISP TIS (BLADE) ×1 IMPLANT
BLADE SURG 15 STRL SS (BLADE) ×1
CANISTER SUC SOCK COL 7IN (MISCELLANEOUS) IMPLANT
CANISTER SUCT 1200ML W/VALVE (MISCELLANEOUS) IMPLANT
CHLORAPREP W/TINT 26ML (MISCELLANEOUS) ×2 IMPLANT
CLIP APPLIE 9.375 MED OPEN (MISCELLANEOUS) IMPLANT
COVER BACK TABLE 60X90IN (DRAPES) ×2 IMPLANT
COVER MAYO STAND STRL (DRAPES) ×2 IMPLANT
COVER PROBE W GEL 5X96 (DRAPES) ×2 IMPLANT
DECANTER SPIKE VIAL GLASS SM (MISCELLANEOUS) IMPLANT
DEVICE DUBIN W/COMP PLATE 8390 (MISCELLANEOUS) ×2 IMPLANT
DRAPE LAPAROSCOPIC ABDOMINAL (DRAPES) IMPLANT
DRAPE LAPAROTOMY 100X72 PEDS (DRAPES) ×2 IMPLANT
DRAPE UTILITY XL STRL (DRAPES) ×2 IMPLANT
ELECT COATED BLADE 2.86 ST (ELECTRODE) ×2 IMPLANT
ELECT REM PT RETURN 9FT ADLT (ELECTROSURGICAL) ×2
ELECTRODE REM PT RTRN 9FT ADLT (ELECTROSURGICAL) ×1 IMPLANT
GLOVE BIOGEL PI IND STRL 8 (GLOVE) ×1 IMPLANT
GLOVE BIOGEL PI INDICATOR 8 (GLOVE) ×1
GLOVE ECLIPSE 8.0 STRL XLNG CF (GLOVE) ×2 IMPLANT
GOWN STRL REUS W/ TWL LRG LVL3 (GOWN DISPOSABLE) ×2 IMPLANT
GOWN STRL REUS W/TWL LRG LVL3 (GOWN DISPOSABLE) ×2
HEMOSTAT SNOW SURGICEL 2X4 (HEMOSTASIS) IMPLANT
KIT MARKER MARGIN INK (KITS) ×2 IMPLANT
LIQUID BAND (GAUZE/BANDAGES/DRESSINGS) ×2 IMPLANT
NEEDLE HYPO 25X1 1.5 SAFETY (NEEDLE) ×2 IMPLANT
NS IRRIG 1000ML POUR BTL (IV SOLUTION) ×2 IMPLANT
PACK BASIN DAY SURGERY FS (CUSTOM PROCEDURE TRAY) ×2 IMPLANT
PENCIL BUTTON HOLSTER BLD 10FT (ELECTRODE) ×2 IMPLANT
SHEARS HARMONIC 9CM CVD (BLADE) ×2 IMPLANT
SLEEVE SCD COMPRESS KNEE MED (MISCELLANEOUS) ×2 IMPLANT
SPONGE LAP 4X18 X RAY DECT (DISPOSABLE) ×2 IMPLANT
SUT MNCRL AB 4-0 PS2 18 (SUTURE) ×2 IMPLANT
SUT SILK 2 0 SH (SUTURE) IMPLANT
SUT VICRYL 3-0 CR8 SH (SUTURE) ×2 IMPLANT
SYR CONTROL 10ML LL (SYRINGE) ×2 IMPLANT
TOWEL OR 17X24 6PK STRL BLUE (TOWEL DISPOSABLE) ×2 IMPLANT
TOWEL OR NON WOVEN STRL DISP B (DISPOSABLE) ×2 IMPLANT
TUBE CONNECTING 20X1/4 (TUBING) IMPLANT
YANKAUER SUCT BULB TIP NO VENT (SUCTIONS) IMPLANT

## 2016-06-02 NOTE — ED Provider Notes (Signed)
TIME SEEN: 1830  CHIEF COMPLAINT: chest pain  HPI: the patient to the ER from surgery center after having a lumpectomy from the right breast. When she came out of anaesthesia she realized that she had substernal CP that felt heavy. It was relieved by sitting forward. No SOB. She did travel to [place]  8 hours two ways in a short span of time recently. No back pain or LE swelling. No N/V/D, no neck pain or abdominal pain or radiation.  ROS: See HPI Constitutional: no fever  Eyes: no drainage  ENT: no runny nose   Cardiovascular: chest pain  Resp: no SOB  GI: no vomiting GU: no dysuria Integumentary: no rash  Allergy: no hives  Musculoskeletal: no leg swelling  Neurological: no slurred speech ROS otherwise negative  PAST MEDICAL HISTORY/PAST SURGICAL HISTORY:  Past Medical History:  Diagnosis Date  . Anxiety   . CHF (congestive heart failure) (HCC)    takes Lasix to keep fluid off heart  . Depression   . Headache    migraines - thinks from stress of Breast cancer diagnosis  . HSV-2 (herpes simplex virus 2) infection   . Hypothyroid   . Pacemaker   . Spontaneous abortion    x3  . SVT (supraventricular tachycardia) (HCC)    3 DEGREE HEART BLOCK/ATRIAL FI    MEDICATIONS:  Prior to Admission medications   Medication Sig Start Date End Date Taking? Authorizing Provider  ALPRAZolam Duanne Moron) 0.5 MG tablet Take 1 tablet (0.5 mg total) by mouth at bedtime as needed for anxiety. 10/15/13   Terrance Mass, MD  Cholecalciferol (VITAMIN D PO) Take by mouth daily.    Historical Provider, MD  furosemide (LASIX) 40 MG tablet Take 40 mg by mouth daily.     Historical Provider, MD  levothyroxine (SYNTHROID, LEVOTHROID) 50 MCG tablet Take 50 mcg by mouth daily.    Historical Provider, MD  Multiple Vitamin (MULTIVITAMIN) tablet Take 1 tablet by mouth daily.    Historical Provider, MD  naproxen (NAPROSYN) 500 MG tablet Take 1 tablet (500 mg total) by mouth 2 (two) times daily with a meal.  06/02/16   Erroll Luna, MD  Ondansetron HCl (ZOFRAN PO) Take by mouth as needed.    Historical Provider, MD  Sertraline HCl (ZOLOFT PO) Take by mouth every morning.    Historical Provider, MD  Topiramate (TOPAMAX PO) Take by mouth.    Historical Provider, MD    ALLERGIES:  Allergies  Allergen Reactions  . Other Hives    "Steri-strips"  . Percocet [Oxycodone-Acetaminophen] Hives    SOCIAL HISTORY:  Social History  Substance Use Topics  . Smoking status: Never Smoker  . Smokeless tobacco: Never Used  . Alcohol use Yes     Comment: HOLIDAYS    FAMILY HISTORY: Family History  Problem Relation Age of Onset  . Heart disease Father   . Cancer Brother     SKIN  . Cancer Maternal Grandmother     OVARIAN  . Breast cancer Paternal Grandmother     EXAM: LMP 04/13/2012   SpO2 95%  CONSTITUTIONAL: Alert and oriented and responds appropriately to questions. Well-appearing; well-nourished HEAD: Normocephalic EYES: Conjunctivae clear, PERRL ENT: normal nose; no rhinorrhea; moist mucous membranes NECK: Supple, no meningismus, no LAD  CARD: RRR;  no murmurs, no clicks, no rubs, no gallops- tenderness to chest wall with post surgical changes RESP: Normal chest effort of breathing. No tachypnea, excursion without; breath sounds clear and equal bilaterally; no wheezes, no  rhonchi, no rales, no hypoxia or respiratory distress, speaking full sentences ABD/GI: Normal bowel sounds; non-distended; soft, non-tender, no rebound, no guarding, no peritoneal signs BACK:  The back appears normal and is non-tender to palpation, there is no CVA tenderness EXT: Normal ROM in all joints; non-tender to palpation; no edema; normal capillary refill; no cyanosis, no calf tenderness or swelling    SKIN: Normal color for age and race; warm; no rash NEURO: Moves all extremities equally, sensation to light touch intact diffusely, cranial nerves II through XII intact PSYCH: The patient's mood and manner are  appropriate. Grooming and personal hygiene are appropriate.  ED COURSE:  Delta Trop is negative. Unable to obtain results from first Troponin. BMP and CBC are not acutely abnormal D-dimer is elevated at 0.77.  Dual paced on EKG- 3rd degree heart block at age of 69. Will obtain CT angio of the chest to r/o PE, pt is currently symptom free. This was a shared visit with Dr. Thomasene Lot.   PLAN:  PE on CT angio is very small in right pulmonary artery  PESI SCORE 61 points Class I, Very Low Risk: 0-1.6% 30-day mortality in this group. Next Steps Copy Results  CT results discussed at length with Dr. Billy Fischer- will start on Xarelto, Pharmacy has come given education and discount card. First dose given in ED. Pt to follow-up with PCP tomorrow and given return precautions.    Delos Haring, PA-C 06/03/16 [id number]    Courteney Julio Alm, MD 06/08/16 [id number]

## 2016-06-02 NOTE — Brief Op Note (Signed)
06/02/2016  3:38 PM  PATIENT:  Dasia D Curt  40 y.o. female  PRE-OPERATIVE DIAGNOSIS:  right breast fibroadenoma  POST-OPERATIVE DIAGNOSIS:  Same  PROCEDURE:  Procedure(s): RIGHT BREAST LUMPECTOMY WITH RADIOACTIVE SEED LOCALIZATION (Right)  SURGEON:  Surgeon(s) and Role:    Erroll Luna, MD - Primary    ASSISTANTS: none   ANESTHESIA:   local and spinal  EBL:  No intake/output data recorded.  BLOOD ADMINISTERED:none  DRAINS: none   LOCAL MEDICATIONS USED:  BUPIVICAINE   SPECIMEN:  Source of Specimen:  right breast   DISPOSITION OF SPECIMEN:  PATHOLOGY  COUNTS:  YES  TOURNIQUET:  * No tourniquets in log *  DICTATION: .Other Dictation: Dictation Number [id number]  PLAN OF CARE: Discharge to home after PACU  PATIENT DISPOSITION:  PACU - hemodynamically stable.   Delay start of Pharmacological VTE agent (>24hrs) due to surgical blood loss or risk of bleeding: not applicable

## 2016-06-02 NOTE — Anesthesia Postprocedure Evaluation (Signed)
Anesthesia Post Note  Patient: [name]  Procedure(s) Performed: Procedure(s) (LRB): RIGHT BREAST LUMPECTOMY WITH RADIOACTIVE SEED LOCALIZATION (Right)  Patient location during evaluation: PACU Anesthesia Type: General Level of consciousness: awake and alert, oriented and patient cooperative Pain management: pain level not controlled Vital Signs Assessment: post-procedure vital signs reviewed and stable Respiratory status: spontaneous breathing, nonlabored ventilation, respiratory function stable and patient connected to nasal cannula oxygen Cardiovascular status: blood pressure returned to baseline and stable (AV paced) Postop Assessment: no signs of nausea or vomiting Anesthetic complications: no Comments: Post-operatively pt complaining of substernal chest pain not associated with right breast pain from surgery. Described pain as central in location, non-radiating, constant, dull pressure type pain as high as a 7/10 on the pain scale. EKG revealed AV paced rhythm. Pt given IV pain and nausea medication in hopes that discomfor would subside. After close monitoring in PACU pain unrelieved with IV pain medication. Vital signs stable throughout and pt continually appeared in no acute distress, however pain unrelenting despite IV pain medication. Emergency [facility] transport arranged and pt discharged to ED for further evaluation.    Last Vitals:  Vitals:   06/02/16 1645 06/02/16 1700  BP: 98/71 103/76  Pulse: 64 61  Resp: 17 (!) 24  Temp:      Last Pain:  Vitals:   06/02/16 1700  TempSrc:   PainSc: 5                  Tiajuana Amass

## 2016-06-02 NOTE — Interval H&P Note (Signed)
History and Physical Interval Note:  06/02/2016 2:31 PM  [name]  has presented today for surgery, with the diagnosis of right breast fibroadenoma  The various methods of treatment have been discussed with the patient and family. After consideration of risks, benefits and other options for treatment, the patient has consented to  Procedure(s): RIGHT BREAST LUMPECTOMY WITH RADIOACTIVE SEED LOCALIZATION (Right) as a surgical intervention .  The patient's history has been reviewed, patient examined, no change in status, stable for surgery.  I have reviewed the patient's chart and labs.  Questions were answered to the patient's satisfaction.     Davinia Riccardi A.

## 2016-06-02 NOTE — Op Note (Signed)
NAMESHOUA, ULLOA            ACCOUNT NO.:  [id number]  MEDICAL RECORD NO.:  [id number]  LOCATION:                                 FACILITY:  PHYSICIAN:  Lorraina Spring A. Leylah Tarnow, M.D.DATE OF BIRTH:  [birth year]  DATE OF PROCEDURE:  06/02/2016 DATE OF DISCHARGE:                              OPERATIVE REPORT   PREOPERATIVE DIAGNOSIS:  Right breast fibroadenoma.  POSTOPERATIVE DIAGNOSIS:  Right breast fibroadenoma.  PROCEDURE:  Right breast seed localized lumpectomy.  SURGEON:  Marcello Moores A. Contrina Orona, M.D.  ANESTHESIA:  LMA with 0.25% Sensorcaine local.  EBL:  Minimal.  SPECIMEN:  Right breast with clip and seed verified by radiograph.  DRAINS:  None.  INDICATIONS FOR PROCEDURE:  The patient is a 40 year old female with a fibroadenoma of her right breast.  This was diagnosed with mammography and core biopsy.  She desired excision.  I counseled her about other options other than surgery for fibroadenomas.  These are benign lesions which I talked to her about, but she still desired excision.  Risks of bleeding, infection, cosmetic deformity, scar formation, pain, and other cosmetic issues discussed with her since she had a previous breast reduction.  She discussed the above with me and still wished to proceed despite its benign nature.  She had concerns that this would be seen on future mammograms and cause problems.  DESCRIPTION OF PROCEDURE:  The patient was met in the holding area and questions were answered.  Right breast was marked with the help of the Neoprobe as the correct side.  The patient was taken back to the operating room and placed supine on the OR table.  After induction of LMA anesthesia, right breast was prepped and draped in a sterile fashion.  Time-out was done.  Neoprobe was used to localize the lesion in the right breast lower outer quadrant.  A curvilinear incision was made over the signal.  Dissection was carried down with the harmonic scalpel and all tissue  around the signal from seed and clip were excised.  Radiograph revealed the clip and seed to be within the specimen.  The cavity was hemostatic.  It was closed after irrigation with 3-0 Vicryl and 4-0 Monocryl.  Liquid adhesive applied.  All final counts found to be correct.  Specimens sent to pathology for further evaluation.  The patient was awoken, extubated, and taken to recovery in satisfactory condition.     Derin Matthes A. Elyan Vanwieren, M.D.     TAC/MEDQ  D:  06/02/2016  T:  06/02/2016  Job:  [id number]

## 2016-06-02 NOTE — Transfer of Care (Signed)
Immediate Anesthesia Transfer of Care Note  Patient: [name]  Procedure(s) Performed: Procedure(s): RIGHT BREAST LUMPECTOMY WITH RADIOACTIVE SEED LOCALIZATION (Right)  Patient Location: PACU  Anesthesia Type:General  Level of Consciousness: awake, sedated and patient cooperative  Airway & Oxygen Therapy: Patient Spontanous Breathing and Patient connected to face mask oxygen  Post-op Assessment: Report given to RN and Post -op Vital signs reviewed and stable  Post vital signs: Reviewed and stable  Last Vitals:  Vitals:   06/02/16 1405  BP: 120/68  Pulse: 82  Resp: 20  Temp: 36.7 C    Last Pain:  Vitals:   06/02/16 1405  TempSrc: Oral         Complications: No apparent anesthesia complications

## 2016-06-02 NOTE — ED Triage Notes (Signed)
Per [facility]: pt coming from Day Surgery with c/o chest pain and nausea. Pt had lumpectomy in right breast performed. Pt now c/o central chest pain 7/10 and nausea. Pt received phenergan prior to arrival to ED. Nausea and chest pain continues. Pt is AV paced

## 2016-06-02 NOTE — H&P (View-Only) (Signed)
[name] 05/13/2016 10:24 AM Location: [facility] Patient #: [id number] DOB: [birth year] Married / Language: Cleophus Molt / Race: White Female  History of Present Illness Kristen Moores A. Sephora Boyar MD; 05/13/2016 12:42 PM) Patient words: Patient sent at the request of Dr. Mariea Clonts for right breast fibroadenoma. She underwent screening mammogram. This discomfort involving her left breast. Ultrasound revealed a mass in the right breast upper outer quadrant core biopsy proven to be fibroadenoma. It measures 7 mm in maximal diameter. She desires excision since she has had previous breast reduction and is uncomfortable leaving the mass in her right breast. Denies any right breast pain, mass or discomfort. She does have some mild discomfort in her left axilla.                        CLINICAL DATA: Patient presents for focal tenderness and palpable abnormality within the upper-outer left breast.  EXAM: 2D DIGITAL DIAGNOSTIC BILATERAL MAMMOGRAM WITH CAD AND ADJUNCT TOMO  ULTRASOUND BILATERAL BREAST  COMPARISON: Previous exam(s).  ACR Breast Density Category c: The breast tissue is heterogeneously dense, which may obscure small masses.  FINDINGS: Within the lower outer right breast there is a 9 mm lobular mass. Postreduction changes identified within the right and left breast. No additional concerning masses, calcifications or architectural distortion identified within either breast.  Mammographic images were processed with CAD.  On physical exam, I palpate no discrete mass within the upper-outer left breast or lower outer right breast.  Targeted ultrasound is performed, showing a lobular 9 x 6 x 9 mm hypoechoic mass within the right breast 7 o'clock position 8 cm from the nipple. There is peripheral color vascular flow. No right axillary lymphadenopathy.  Palpable abnormality within the left breast 1 o'clock position 7 cm from the nipple corresponds  with pacer wires.  IMPRESSION: Indeterminate hypoechoic right breast mass 7 o'clock position.  RECOMMENDATION: Ultrasound-guided core needle biopsy indeterminate right breast mass.  I have discussed the findings and recommendations with the patient. Results were also provided in writing at the conclusion of the visit. If applicable, a reminder letter will be sent to the patient regarding the next appointment.  BI-RADS CATEGORY 4: Suspicious.   Electronically Signed By: Lovey Newcomer M.D. On: 04/27/2016 16:43     CLINICAL DATA: Status post ultrasound-guided core needle biopsy of a 9 mm mass in the 7 o'clock position of the right breast.  EXAM: DIAGNOSTIC RIGHT MAMMOGRAM POST ULTRASOUND BIOPSY  COMPARISON: Previous exam(s).  FINDINGS: Mammographic images were obtained following ultrasound guided biopsy of the recently demonstrated 9 mm mass in the 7 o'clock position of the right breast. These demonstrate a ribbon shaped biopsy marker clip at the location of the biopsied mass.  IMPRESSION: Appropriate clip deployment following right breast ultrasound-guided core needle biopsy.  Final Assessment: Post Procedure Mammograms for Marker Placement   Electronically Signed By: Claudie Revering M.D. On: 04/29/2016 17:05        Diagnosis Breast, right, needle core biopsy, 7 o'clock - FIBROADENOMA. - THERE IS NO EVIDENCE OF MALIGNANCY. - SEE COMMENT.  The patient is a 40 year old female.   Other Problems [name], CMA; 05/13/2016 10:24 AM) Other disease, cancer, significant illness  Past Surgical History [name], CMA; 05/13/2016 10:24 AM) Gallbladder Surgery - Laparoscopic Hysterectomy (not due to cancer) - Partial Mammoplasty; Reduction Bilateral. Oral Surgery  Diagnostic Studies History [name], [place]; 05/13/2016 10:24 AM) Colonoscopy never Mammogram 1-3 years ago  Allergies [name], [place]; 05/13/2016  10:25 AM) Steri-Strip *MEDICAL  DEVICES AND SUPPLIES* Rash. Oxycodone-Acetaminophen *ANALGESICS - OPIOID*  Medication History [name], CMA; 05/13/2016 10:27 AM) ALPRAZolam (0.5MG  Tablet, Oral) Active. Furosemide (40MG  Tablet, Oral) Active. Levothyroxine Sodium (50MCG Tablet, Oral) Active. Ondansetron HCl (8MG  Tablet, Oral) Active. Sertraline HCl (25MG  Tablet, Oral) Active. Topiramate (50MG  Tablet, Oral) Active. Multiple Vitamin (Oral) Active. PriLOSEC (20MG  Capsule DR, Oral) Active. Vitamin D (Cholecalciferol) (1000UNIT Tablet, Oral) Active. Magnesium (100MG  Tablet, Oral) Active. Medications Reconciled  Social History [name], [place]; 05/13/2016 10:24 AM) Alcohol use Occasional alcohol use. Caffeine use Tea. No drug use Tobacco use Never smoker.  Family History [name], [place]; 05/13/2016 10:24 AM) Depression Sister. Heart Disease Father. Hypertension Father.  Pregnancy / Birth History [name], CMA; 05/13/2016 10:24 AM) Age at menarche 35 years. Age of menopause <44 Gravida 5 Irregular periods Maternal age 70-25 Para 2     Review of Systems [name] CMA; 05/13/2016 10:24 AM) General Not Present- Appetite Loss, Chills, Fatigue, Fever, Night Sweats, Weight Gain and Weight Loss. Skin Not Present- Change in Wart/Mole, Dryness, Hives, Jaundice, New Lesions, Non-Healing Wounds, Rash and Ulcer. HEENT Not Present- Earache, Hearing Loss, Hoarseness, Nose Bleed, Oral Ulcers, Ringing in the Ears, Seasonal Allergies, Sinus Pain, Sore Throat, Visual Disturbances, Wears glasses/contact lenses and Yellow Eyes. Respiratory Not Present- Bloody sputum, Chronic Cough, Difficulty Breathing, Snoring and Wheezing. Breast Present- Breast Mass. Not Present- Breast Pain, Nipple Discharge and Skin Changes. Cardiovascular Not Present- Chest Pain, Difficulty Breathing Lying Down, Leg Cramps, Palpitations, Rapid Heart Rate, Shortness of Breath and Swelling of Extremities. Gastrointestinal Not  Present- Abdominal Pain, Bloating, Bloody Stool, Change in Bowel Habits, Chronic diarrhea, Constipation, Difficulty Swallowing, Excessive gas, Gets full quickly at meals, Hemorrhoids, Indigestion, Nausea, Rectal Pain and Vomiting. Female Genitourinary Not Present- Frequency, Nocturia, Painful Urination, Pelvic Pain and Urgency. Musculoskeletal Not Present- Back Pain, Joint Pain, Joint Stiffness, Muscle Pain, Muscle Weakness and Swelling of Extremities. Neurological Not Present- Decreased Memory, Fainting, Headaches, Numbness, Seizures, Tingling, Tremor, Trouble walking and Weakness. Endocrine Not Present- Cold Intolerance, Excessive Hunger, Hair Changes, Heat Intolerance, Hot flashes and New Diabetes. Hematology Not Present- Blood Thinners, Easy Bruising, Excessive bleeding, Gland problems, HIV and Persistent Infections.  Vitals [name] CMA; 05/13/2016 10:27 AM) 05/13/2016 10:27 AM Weight: 207.6 lb Height: 64in Body Surface Area: 1.99 m Body Mass Index: 35.63 kg/m  Temp.: 98.2F(Temporal)  Pulse: 92 (Regular)  BP: 126/72 (Sitting, Left Arm, Standard)      Physical Exam (Kristen Towner A. Piercen Covino MD; 05/13/2016 12:43 PM)  General Mental Status-Alert. General Appearance-Consistent with stated age. Hydration-Well hydrated. Voice-Normal.  Head and Neck Head-normocephalic, atraumatic with no lesions or palpable masses. Trachea-midline. Thyroid Gland Characteristics - normal size and consistency.  Chest and Lung Exam Chest and lung exam reveals -quiet, even and easy respiratory effort with no use of accessory muscles and on auscultation, normal breath sounds, no adventitious sounds and normal vocal resonance. Inspection Chest Wall - Normal. Back - normal.  Breast Note: Bilateral breast reduction scars noted. Mild fullness right breast upper outer quadrant. Mild ecchymoses inferior mammary fold right breast. Left breast is normal. Normal fibroglandular tissue  left axilla.  Cardiovascular Cardiovascular examination reveals -normal heart sounds, regular rate and rhythm with no murmurs and normal pedal pulses bilaterally.  Neurologic Neurologic evaluation reveals -alert and oriented x 3 with no impairment of recent or remote memory. Mental Status-Normal.  Musculoskeletal Normal Exam - Left-Upper Extremity Strength Normal and Lower Extremity Strength Normal. Normal Exam - Right-Upper Extremity Strength Normal and Lower  Extremity Strength Normal.  Lymphatic Head & Neck  General Head & Neck Lymphatics: Bilateral - Description - Normal. Axillary  General Axillary Region: Bilateral - Description - Normal. Tenderness - Non Tender.    Assessment & Plan (Canden Cieslinski A. Sadao Weyer MD; 05/13/2016 12:43 PM)  BREAST FIBROADENOMA, RIGHT (D24.1) Impression: Patient desires right breast seemed localized lumpectomy for excision of fibroadenoma. Nonoperative options offered as well as observation. Risk of lumpectomy include bleeding, infection, seroma, more surgery, use of seed/wire, wound care, cosmetic deformity and the need for other treatments, death , blood clots, death. Pt agrees to proceed.  Current Plans You are being scheduled for surgery - Our schedulers will call you.  You should hear from our office's scheduling department within 5 working days about the location, date, and time of surgery. We try to make accommodations for patient's preferences in scheduling surgery, but sometimes the OR schedule or the surgeon's schedule prevents [place] from making those accommodations.  If you have not heard from our office [phone] in 5 working days, call the office and ask for your surgeon's nurse.  If you have other questions about your diagnosis, plan, or surgery, call the office and ask for your surgeon's nurse.  Pt Education - [facility] Breast Biopsy HCI: discussed with patient and provided information. The anatomy and the physiology was discussed. The  pathophysiology and natural history of the disease was discussed. Options were discussed and recommendations were made. Technique, risks, benefits, & alternatives were discussed. Risks such as stroke, heart attack, bleeding, indection, death, and other risks discussed. Questions answered. The patient agrees to proceed.

## 2016-06-02 NOTE — Anesthesia Procedure Notes (Signed)
Procedure Name: LMA Insertion Performed by: Ignatz Deis S Pre-anesthesia Checklist: Patient identified, Emergency Drugs available, Suction available and Patient being monitored Patient Re-evaluated:Patient Re-evaluated prior to inductionOxygen Delivery Method: Circle system utilized Preoxygenation: Pre-oxygenation with 100% oxygen Intubation Type: IV induction Ventilation: Mask ventilation without difficulty LMA: LMA inserted LMA Size: 4.0 Number of attempts: 1 Airway Equipment and Method: Bite block Placement Confirmation: positive ETCO2 Tube secured with: Tape Dental Injury: Teeth and Oropharynx as per pre-operative assessment        

## 2016-06-02 NOTE — Discharge Instructions (Signed)
[facility] °Office Phone Number [phone] ° °BREAST BIOPSY/ LUMPECTOMY  POST OP INSTRUCTIONS ° °Always review your discharge instruction sheet given to you by the facility where your surgery was performed. ° °IF YOU HAVE DISABILITY OR FAMILY LEAVE FORMS, YOU MUST BRING THEM TO THE OFFICE FOR PROCESSING.  DO NOT GIVE THEM TO YOUR DOCTOR. ° °1. A prescription for pain medication may be given to you upon discharge.  Take your pain medication as prescribed, if needed.  If narcotic pain medicine is not needed, then you may take acetaminophen (Tylenol) or ibuprofen (Advil) as needed. °2. Take your usually prescribed medications unless otherwise directed °3. If you need a refill on your pain medication, please contact your pharmacy.  They will contact our office to request authorization.  Prescriptions will not be filled after 5pm or on week-ends. °4. You should eat very light the first 24 hours after surgery, such as soup, crackers, pudding, etc.  Resume your normal diet the day after surgery. °5. Most patients will experience some swelling and bruising in the breast.  Ice packs and a good support bra will help.  Swelling and bruising can take several days to resolve.  °6. It is common to experience some constipation if taking pain medication after surgery.  Increasing fluid intake and taking a stool softener will usually help or prevent this problem from occurring.  A mild laxative (Milk of Magnesia or Miralax) should be taken according to package directions if there are no bowel movements after 48 hours. °7. Unless discharge instructions indicate otherwise, you may remove your bandages 24-48 hours after surgery, and you may shower at that time.  You may have steri-strips (small skin tapes) in place directly over the incision.  These strips should be left on the skin for 7-10 days.  If your surgeon used skin glue on the incision, you may shower in 24 hours.  The glue will flake off over the next 2-3  weeks.  Any sutures or staples will be removed at the office during your follow-up visit. °8. ACTIVITIES:  You may resume regular daily activities (gradually increasing) beginning the next day.  Wearing a good support bra or sports bra minimizes pain and swelling.  You may have sexual intercourse when it is comfortable. °a. You may drive when you no longer are taking prescription pain medication, you can comfortably wear a seatbelt, and you can safely maneuver your car and apply brakes. °b. RETURN TO WORK:  ______________________________________________________________________________________ °9. You should see your doctor in the office for a follow-up appointment approximately two weeks after your surgery.  Your doctor’s nurse will typically make your follow-up appointment when she calls you with your pathology report.  Expect your pathology report 2-3 business days after your surgery.  You may call to check if you do not hear from us after three days. ° ° °WHEN TO CALL YOUR DOCTOR: °1. Fever over 101.0 °2. Nausea and/or vomiting. °3. Extreme swelling or bruising. °4. Continued bleeding from incision. °5. Increased pain, redness, or drainage from the incision. ° °The clinic staff is available to answer your questions during regular business hours.  Please don’t hesitate to call and ask to speak to one of the nurses for clinical concerns.  If you have a medical emergency, go to the nearest emergency room or call 911.  A surgeon from [facility] is always on call at the hospital. ° °For further questions, please visit [email or web address]  ° ° ° °Post Anesthesia Home Care   Instructions ° °Activity: °Get plenty of rest for the remainder of the day. A responsible adult should stay with you for 24 hours following the procedure.  °For the next 24 hours, DO NOT: °-Drive a car °-Operate machinery °-Drink alcoholic beverages °-Take any medication unless instructed by your physician °-Make any legal  decisions or sign important papers. ° °Meals: °Start with liquid foods such as gelatin or soup. Progress to regular foods as tolerated. Avoid greasy, spicy, heavy foods. If nausea and/or vomiting occur, drink only clear liquids until the nausea and/or vomiting subsides. Call your physician if vomiting continues. ° °Special Instructions/Symptoms: °Your throat may feel dry or sore from the anesthesia or the breathing tube placed in your throat during surgery. If this causes discomfort, gargle with warm salt water. The discomfort should disappear within 24 hours. ° °If you had a scopolamine patch placed behind your ear for the management of post- operative nausea and/or vomiting: ° °1. The medication in the patch is effective for 72 hours, after which it should be removed.  Wrap patch in a tissue and discard in the trash. Wash hands thoroughly with soap and water. °2. You may remove the patch earlier than 72 hours if you experience unpleasant side effects which may include dry mouth, dizziness or visual disturbances. °3. Avoid touching the patch. Wash your hands with soap and water after contact with the patch. °  ° ° ° °

## 2016-06-02 NOTE — Anesthesia Preprocedure Evaluation (Addendum)
Anesthesia Evaluation  Patient identified by MRN, date of birth, ID band Patient awake    Reviewed: Allergy & Precautions, NPO status , Patient's Chart, lab work & pertinent test results  Airway Mallampati: II  TM Distance: >3 FB Neck ROM: Full    Dental   Pulmonary neg pulmonary ROS,    breath sounds clear to auscultation       Cardiovascular +CHF  + pacemaker  Rhythm:Regular Rate:Normal     Neuro/Psych Anxiety Depression negative neurological ROS     GI/Hepatic negative GI ROS, Neg liver ROS,   Endo/Other  Hypothyroidism   Renal/GU negative Renal ROS     Musculoskeletal   Abdominal   Peds  Hematology negative hematology ROS (+)   Anesthesia Other Findings   Reproductive/Obstetrics                             Anesthesia Physical Anesthesia Plan  ASA: II  Anesthesia Plan: General   Post-op Pain Management:    Induction: Intravenous  Airway Management Planned: LMA  Additional Equipment:   Intra-op Plan:   Post-operative Plan: Extubation in OR  Informed Consent: I have reviewed the patients History and Physical, chart, labs and discussed the procedure including the risks, benefits and alternatives for the proposed anesthesia with the patient or authorized representative who has indicated his/her understanding and acceptance.   Dental advisory given  Plan Discussed with: CRNA  Anesthesia Plan Comments:         Anesthesia Quick Evaluation

## 2016-06-03 ENCOUNTER — Encounter (HOSPITAL_BASED_OUTPATIENT_CLINIC_OR_DEPARTMENT_OTHER): Payer: Self-pay | Admitting: Surgery

## 2016-06-03 DIAGNOSIS — R0789 Other chest pain: Secondary | ICD-10-CM | POA: Diagnosis not present

## 2016-06-03 LAB — I-STAT TROPONIN, ED
Troponin i, poc: 0 ng/mL (ref 0.00–0.08)
Troponin i, poc: 0 ng/mL (ref 0.00–0.08)

## 2016-06-03 MED ORDER — RIVAROXABAN 15 MG PO TABS
15.0000 mg | ORAL_TABLET | Freq: Two times a day (BID) | ORAL | Status: DC
  Filled 2016-06-03: qty 1

## 2016-06-03 MED ORDER — RIVAROXABAN (XARELTO) EDUCATION KIT FOR DVT/PE PATIENTS
PACK | Freq: Once | Status: AC
  Administered 2016-06-03: 03:00:00
  Filled 2016-06-03: qty 1

## 2016-06-03 MED ORDER — RIVAROXABAN (XARELTO) VTE STARTER PACK (15 & 20 MG): 15.0000 mg | ORAL_TABLET | Freq: Two times a day (BID) | ORAL | 0 refills | Status: AC

## 2016-06-03 MED ORDER — RIVAROXABAN 15 MG PO TABS
15.0000 mg | ORAL_TABLET | Freq: Once | ORAL | Status: AC
  Administered 2016-06-03: 15 mg via ORAL
  Filled 2016-06-03: qty 1

## 2016-06-03 NOTE — ED Notes (Signed)
Patient able to ambulate independently  Pt sent here directly after surgery and states she never had d/c instructions discussed.  Discharge packet from surgical center printed and reviewed with patient.  Will follow up with [facility] tomorrow morning.  Pt concerned about Naproxen prescription and interactions with Xarelto.  PA to review, and patient to call [facility] in the morning for follow up on meds.

## 2016-06-03 NOTE — Discharge Instructions (Signed)
Information on my medicine - XARELTO (rivaroxaban)  This medication education was reviewed with me or my healthcare representative as part of my discharge preparation.  The pharmacist that spoke with me during my hospital stay was:  Sindy Gu[place], [place]? Xarelto was prescribed to treat blood clots that may have been found in the veins of your legs (deep vein thrombosis) or in your lungs (pulmonary embolism) and to reduce the risk of them occurring again.  What do you need to know about Xarelto? The starting dose is one 15 mg tablet taken TWICE daily with food for the FIRST 21 DAYS then on 06/24/16  the dose is changed to one 20 mg tablet taken ONCE A DAY with your evening meal.  DO NOT stop taking Xarelto without talking to the health care provider who prescribed the medication.  Refill your prescription for 20 mg tablets before you run out.  After discharge, you should have regular check-up appointments with your healthcare provider that is prescribing your Xarelto.  In the future your dose may need to be changed if your kidney function changes by a significant amount.  What do you do if you miss a dose? If you are taking Xarelto TWICE DAILY and you miss a dose, take it as soon as you remember. You may take two 15 mg tablets (total 30 mg) at the same time then resume your regularly scheduled 15 mg twice daily the next day.  If you are taking Xarelto ONCE DAILY and you miss a dose, take it as soon as you remember on the same day then continue your regularly scheduled once daily regimen the next day. Do not take two doses of Xarelto at the same time.   Important Safety Information Xarelto is a blood thinner medicine that can cause bleeding. You should call your healthcare provider right away if you experience any of the following: ? Bleeding from an injury or your nose that does not stop. ? Unusual colored urine (red or dark brown) or unusual  colored stools (red or black). ? Unusual bruising for unknown reasons. ? A serious fall or if you hit your head (even if there is no bleeding).  Some medicines may interact with Xarelto and might increase your risk of bleeding while on Xarelto. To help avoid this, consult your healthcare provider or pharmacist prior to using any new prescription or non-prescription medications, including herbals, vitamins, non-steroidal anti-inflammatory drugs (NSAIDs) and supplements.  This website has more information on Xarelto: [email or web address]

## 2016-06-08 DIAGNOSIS — I2699 Other pulmonary embolism without acute cor pulmonale: Secondary | ICD-10-CM | POA: Diagnosis not present

## 2016-06-10 DIAGNOSIS — I483 Typical atrial flutter: Secondary | ICD-10-CM | POA: Diagnosis not present

## 2016-06-10 DIAGNOSIS — I442 Atrioventricular block, complete: Secondary | ICD-10-CM | POA: Diagnosis not present

## 2016-06-10 DIAGNOSIS — Z6835 Body mass index (BMI) 35.0-35.9, adult: Secondary | ICD-10-CM | POA: Diagnosis not present

## 2016-06-10 DIAGNOSIS — Z950 Presence of cardiac pacemaker: Secondary | ICD-10-CM | POA: Diagnosis not present

## 2016-06-10 DIAGNOSIS — R609 Edema, unspecified: Secondary | ICD-10-CM | POA: Diagnosis not present

## 2016-06-10 DIAGNOSIS — I82401 Acute embolism and thrombosis of unspecified deep veins of right lower extremity: Secondary | ICD-10-CM | POA: Diagnosis not present

## 2016-06-10 DIAGNOSIS — I471 Supraventricular tachycardia: Secondary | ICD-10-CM | POA: Diagnosis not present

## 2016-06-10 DIAGNOSIS — I2699 Other pulmonary embolism without acute cor pulmonale: Secondary | ICD-10-CM | POA: Diagnosis not present

## 2016-06-24 DIAGNOSIS — R609 Edema, unspecified: Secondary | ICD-10-CM | POA: Diagnosis not present

## 2016-07-01 DIAGNOSIS — Z83511 Family history of glaucoma: Secondary | ICD-10-CM | POA: Diagnosis not present

## 2016-07-01 DIAGNOSIS — H40013 Open angle with borderline findings, low risk, bilateral: Secondary | ICD-10-CM | POA: Diagnosis not present

## 2016-08-02 DIAGNOSIS — I82401 Acute embolism and thrombosis of unspecified deep veins of right lower extremity: Secondary | ICD-10-CM | POA: Diagnosis not present

## 2016-08-02 DIAGNOSIS — I2699 Other pulmonary embolism without acute cor pulmonale: Secondary | ICD-10-CM | POA: Diagnosis not present

## 2016-08-02 DIAGNOSIS — Z6835 Body mass index (BMI) 35.0-35.9, adult: Secondary | ICD-10-CM | POA: Diagnosis not present

## 2016-08-08 DIAGNOSIS — Z23 Encounter for immunization: Secondary | ICD-10-CM | POA: Diagnosis not present

## 2016-09-09 DIAGNOSIS — N3000 Acute cystitis without hematuria: Secondary | ICD-10-CM | POA: Diagnosis not present

## 2016-09-14 DIAGNOSIS — N3000 Acute cystitis without hematuria: Secondary | ICD-10-CM | POA: Diagnosis not present

## 2016-09-26 DIAGNOSIS — N3000 Acute cystitis without hematuria: Secondary | ICD-10-CM | POA: Diagnosis not present

## 2016-10-31 DIAGNOSIS — Z09 Encounter for follow-up examination after completed treatment for conditions other than malignant neoplasm: Secondary | ICD-10-CM | POA: Diagnosis not present

## 2016-10-31 DIAGNOSIS — I82401 Acute embolism and thrombosis of unspecified deep veins of right lower extremity: Secondary | ICD-10-CM | POA: Diagnosis not present

## 2016-10-31 DIAGNOSIS — Z86718 Personal history of other venous thrombosis and embolism: Secondary | ICD-10-CM | POA: Diagnosis not present

## 2016-10-31 DIAGNOSIS — R600 Localized edema: Secondary | ICD-10-CM | POA: Diagnosis not present

## 2016-10-31 DIAGNOSIS — Z7901 Long term (current) use of anticoagulants: Secondary | ICD-10-CM | POA: Diagnosis not present

## 2016-10-31 DIAGNOSIS — Z8249 Family history of ischemic heart disease and other diseases of the circulatory system: Secondary | ICD-10-CM | POA: Diagnosis not present

## 2016-10-31 DIAGNOSIS — Z950 Presence of cardiac pacemaker: Secondary | ICD-10-CM | POA: Diagnosis not present

## 2016-10-31 DIAGNOSIS — I2699 Other pulmonary embolism without acute cor pulmonale: Secondary | ICD-10-CM | POA: Insufficient documentation

## 2016-10-31 DIAGNOSIS — Z86711 Personal history of pulmonary embolism: Secondary | ICD-10-CM | POA: Diagnosis not present

## 2016-10-31 DIAGNOSIS — G629 Polyneuropathy, unspecified: Secondary | ICD-10-CM | POA: Diagnosis not present

## 2016-10-31 DIAGNOSIS — Z6836 Body mass index (BMI) 36.0-36.9, adult: Secondary | ICD-10-CM | POA: Diagnosis not present

## 2016-10-31 DIAGNOSIS — Z9889 Other specified postprocedural states: Secondary | ICD-10-CM | POA: Diagnosis not present

## 2016-10-31 DIAGNOSIS — I4891 Unspecified atrial fibrillation: Secondary | ICD-10-CM | POA: Diagnosis not present

## 2016-10-31 DIAGNOSIS — R238 Other skin changes: Secondary | ICD-10-CM | POA: Diagnosis not present

## 2017-02-20 DIAGNOSIS — J019 Acute sinusitis, unspecified: Secondary | ICD-10-CM | POA: Diagnosis not present

## 2017-03-08 ENCOUNTER — Encounter: Payer: Self-pay | Admitting: Gynecology

## 2017-03-22 DIAGNOSIS — J029 Acute pharyngitis, unspecified: Secondary | ICD-10-CM | POA: Diagnosis not present

## 2017-03-22 DIAGNOSIS — T6291XA Toxic effect of unspecified noxious substance eaten as food, accidental (unintentional), initial encounter: Secondary | ICD-10-CM | POA: Diagnosis not present

## 2017-03-22 DIAGNOSIS — A020 Salmonella enteritis: Secondary | ICD-10-CM | POA: Diagnosis not present

## 2017-03-24 DIAGNOSIS — H40013 Open angle with borderline findings, low risk, bilateral: Secondary | ICD-10-CM | POA: Diagnosis not present

## 2017-03-24 DIAGNOSIS — R740 Nonspecific elevation of levels of transaminase and lactic acid dehydrogenase [LDH]: Secondary | ICD-10-CM | POA: Diagnosis not present

## 2017-03-24 DIAGNOSIS — R399 Unspecified symptoms and signs involving the genitourinary system: Secondary | ICD-10-CM | POA: Diagnosis not present

## 2017-03-28 DIAGNOSIS — Z0000 Encounter for general adult medical examination without abnormal findings: Secondary | ICD-10-CM | POA: Diagnosis not present

## 2017-03-28 DIAGNOSIS — T6291XA Toxic effect of unspecified noxious substance eaten as food, accidental (unintentional), initial encounter: Secondary | ICD-10-CM | POA: Diagnosis not present

## 2017-03-28 DIAGNOSIS — A020 Salmonella enteritis: Secondary | ICD-10-CM | POA: Diagnosis not present

## 2017-03-28 DIAGNOSIS — N3000 Acute cystitis without hematuria: Secondary | ICD-10-CM | POA: Diagnosis not present

## 2017-05-02 ENCOUNTER — Other Ambulatory Visit: Payer: Self-pay | Admitting: Internal Medicine

## 2017-05-02 DIAGNOSIS — Z1231 Encounter for screening mammogram for malignant neoplasm of breast: Secondary | ICD-10-CM

## 2017-05-11 ENCOUNTER — Encounter (INDEPENDENT_AMBULATORY_CARE_PROVIDER_SITE_OTHER): Payer: Self-pay

## 2017-05-11 ENCOUNTER — Ambulatory Visit
Admission: RE | Admit: 2017-05-11 | Discharge: 2017-05-11 | Disposition: A | Discharge status: Home / Self Care | Payer: BLUE CROSS/BLUE SHIELD | Source: Ambulatory Visit | Attending: Internal Medicine | Admitting: Internal Medicine

## 2017-05-11 DIAGNOSIS — Z1231 Encounter for screening mammogram for malignant neoplasm of breast: Secondary | ICD-10-CM | POA: Diagnosis not present

## 2017-06-09 DIAGNOSIS — L0501 Pilonidal cyst with abscess: Secondary | ICD-10-CM | POA: Diagnosis not present

## 2017-06-09 DIAGNOSIS — R509 Fever, unspecified: Secondary | ICD-10-CM | POA: Diagnosis not present

## 2017-06-09 DIAGNOSIS — Z6836 Body mass index (BMI) 36.0-36.9, adult: Secondary | ICD-10-CM | POA: Diagnosis not present

## 2017-06-09 DIAGNOSIS — I97190 Other postprocedural cardiac functional disturbances following cardiac surgery: Secondary | ICD-10-CM | POA: Insufficient documentation

## 2017-06-10 DIAGNOSIS — R5383 Other fatigue: Secondary | ICD-10-CM | POA: Diagnosis not present

## 2017-06-10 DIAGNOSIS — Z0000 Encounter for general adult medical examination without abnormal findings: Secondary | ICD-10-CM | POA: Diagnosis not present

## 2017-06-15 DIAGNOSIS — I499 Cardiac arrhythmia, unspecified: Secondary | ICD-10-CM | POA: Diagnosis not present

## 2017-06-15 DIAGNOSIS — Z6836 Body mass index (BMI) 36.0-36.9, adult: Secondary | ICD-10-CM | POA: Diagnosis not present

## 2017-06-15 DIAGNOSIS — I481 Persistent atrial fibrillation: Secondary | ICD-10-CM | POA: Diagnosis not present

## 2017-06-15 DIAGNOSIS — Z45010 Encounter for checking and testing of cardiac pacemaker pulse generator [battery]: Secondary | ICD-10-CM | POA: Diagnosis not present

## 2017-06-15 DIAGNOSIS — I442 Atrioventricular block, complete: Secondary | ICD-10-CM | POA: Diagnosis not present

## 2017-06-25 IMAGING — CT CT ANGIO CHEST
2 of 6 series · 18 of 36 positions shown · IV contrast (Omni 300)
Comparison: Prior radiograph from earlier same day.

CLINICAL DATA: Initial evaluation for acute chest pain, elevated
D-dimer. History of breast surgery today. History of cardiac surgery
and CHF.

EXAM:
CT ANGIOGRAPHY CHEST WITH CONTRAST
TECHNIQUE: Multidetector CT imaging of the chest was performed using the
standard protocol during bolus administration of intravenous
contrast. Multiplanar CT image reconstructions and MIPs were
obtained to evaluate the vascular anatomy.
CONTRAST:  73 cc of Isovue 370.

[Series 6: pe thins · axial · 0.65mm/px · z∈[+1242,+1466]mm · 17 of 252 slices shown]
[im 14/252  lung]
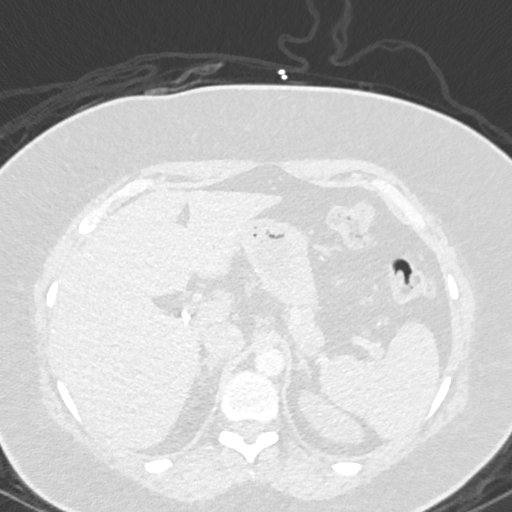
[im 28/252  mediastinal]
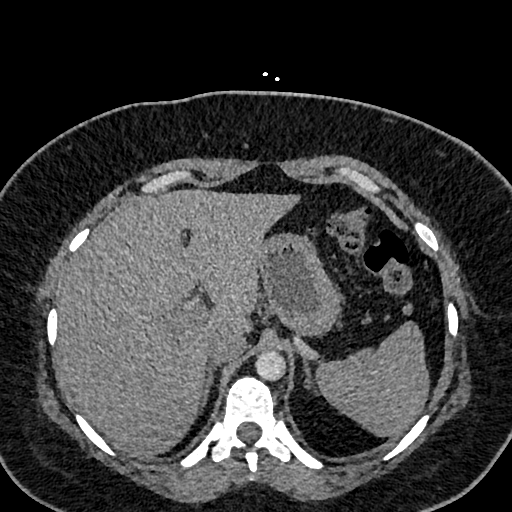
[im 42/252  lung]
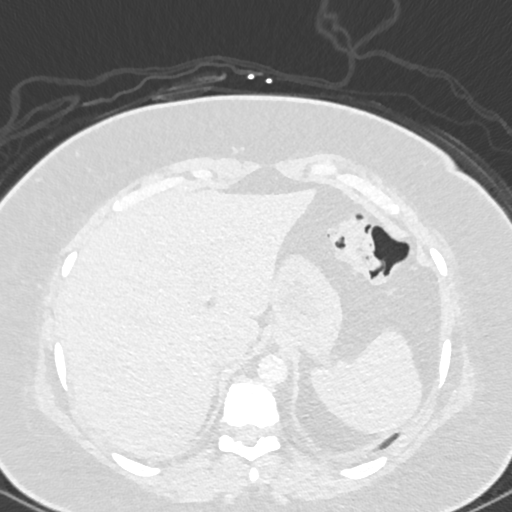
[im 56/252  mediastinal]
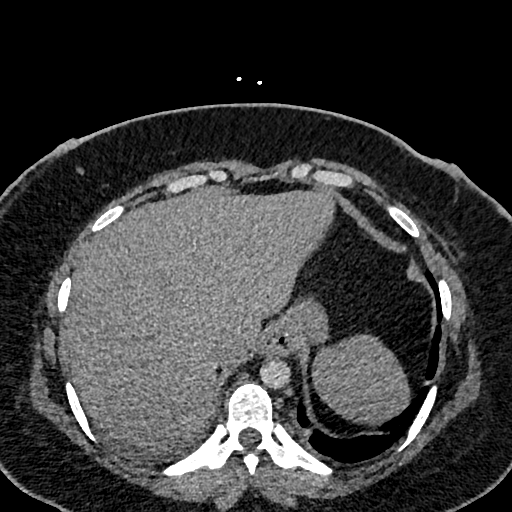
[im 70/252  lung]
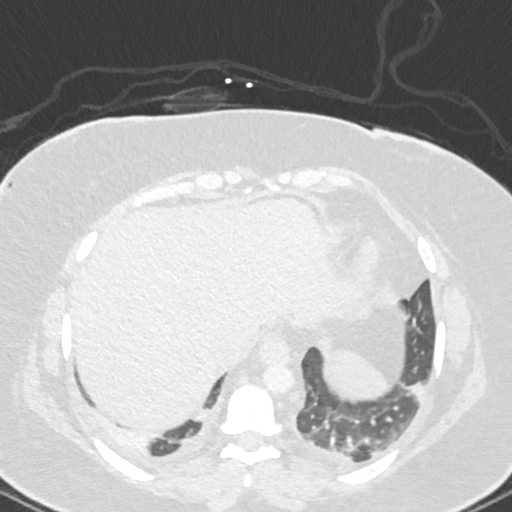
[im 84/252  mediastinal]
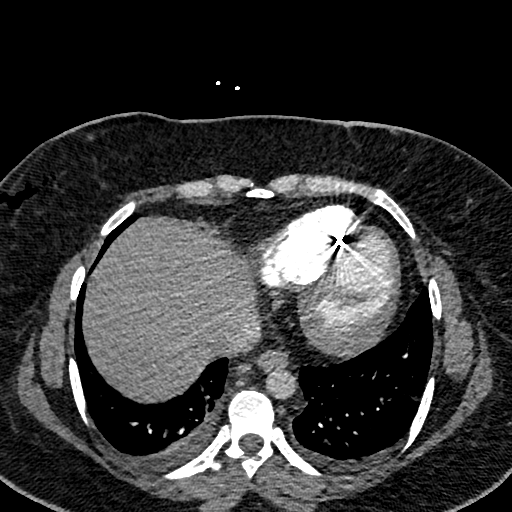
[im 98/252  lung]
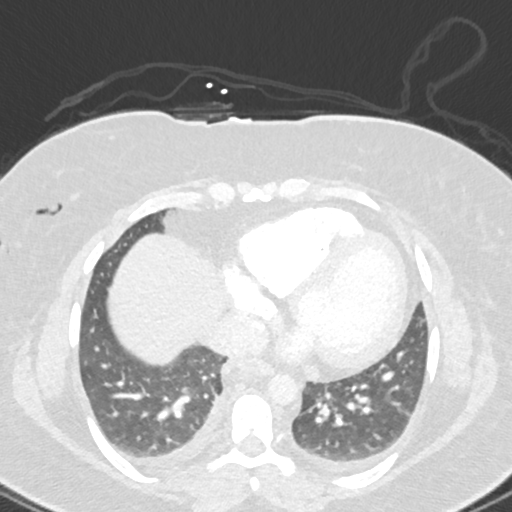
[im 112/252  mediastinal]
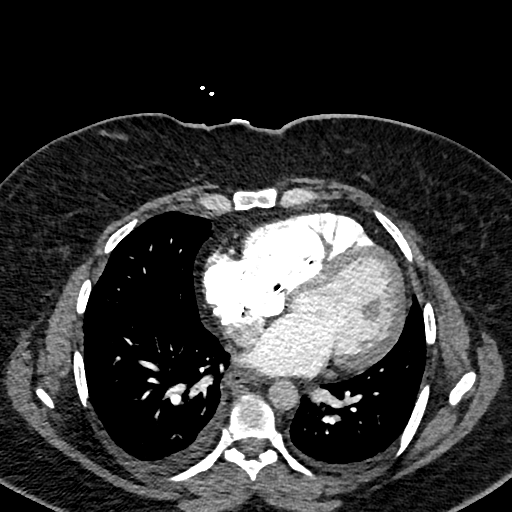
[im 126/252  lung]
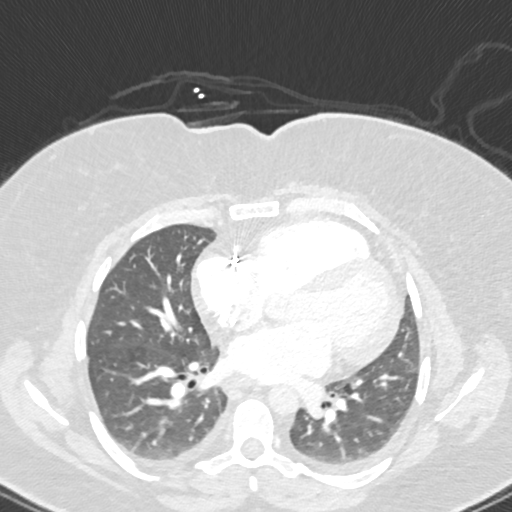
[im 140/252  mediastinal]
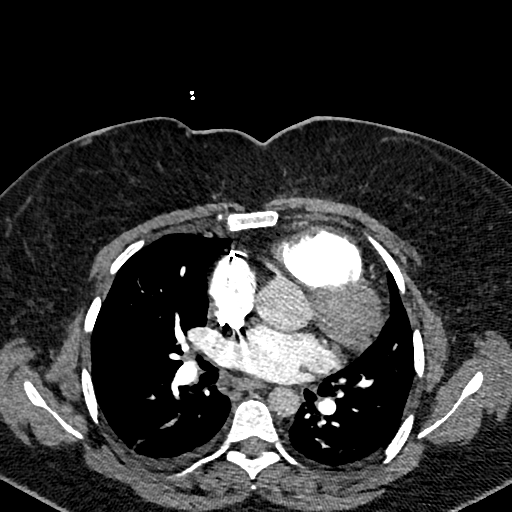
[im 154/252  lung]
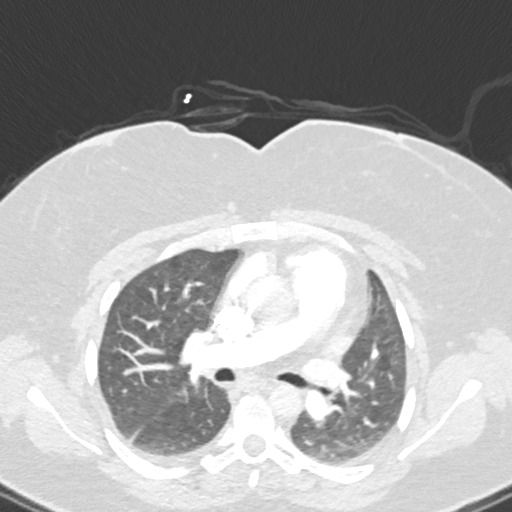
[im 168/252  mediastinal]
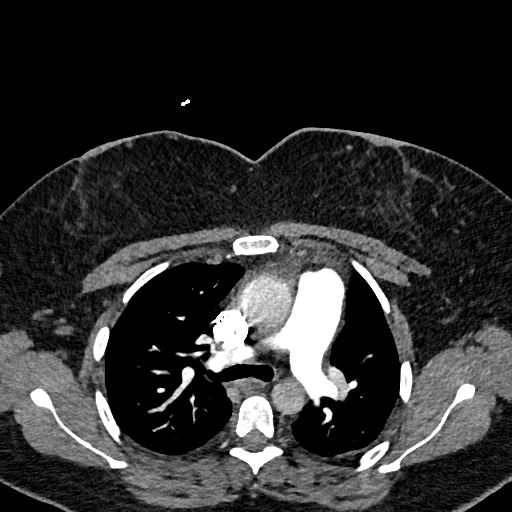
[im 182/252  lung]
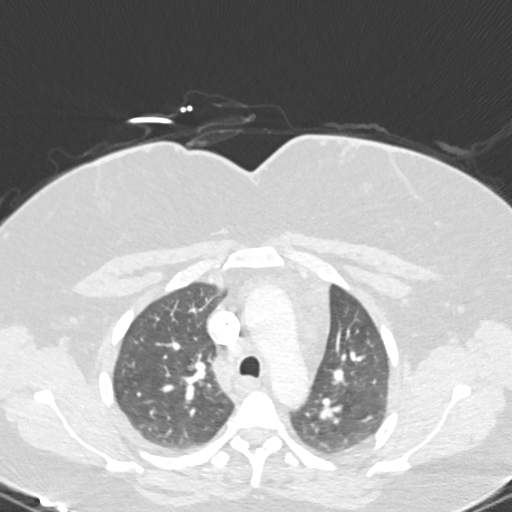
[im 196/252  mediastinal]
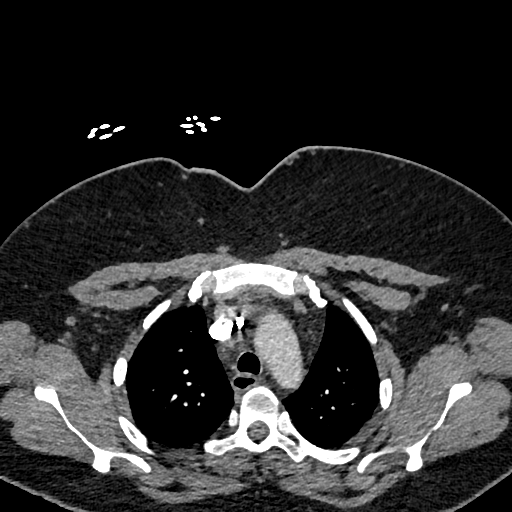
[im 210/252  lung]
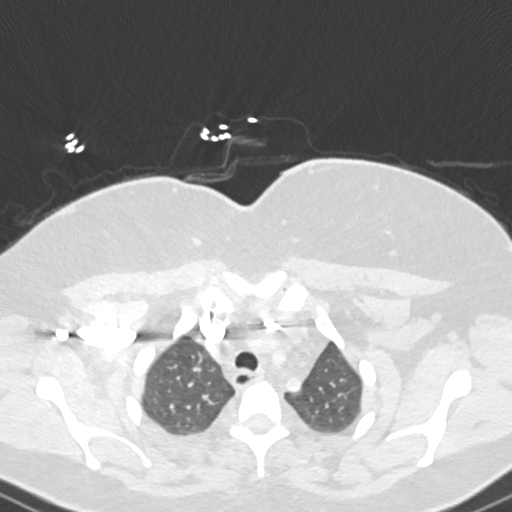
[im 224/252  mediastinal]
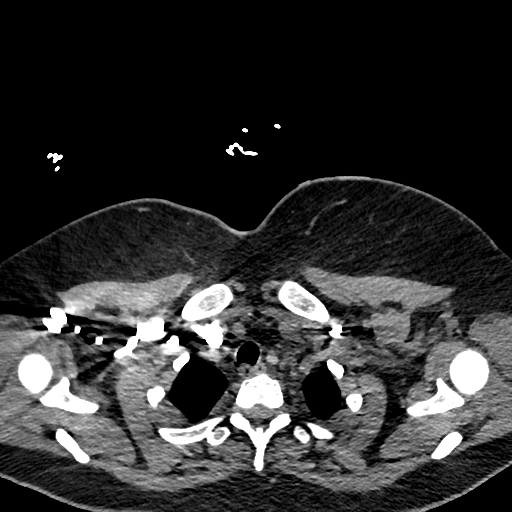
[im 238/252  lung]
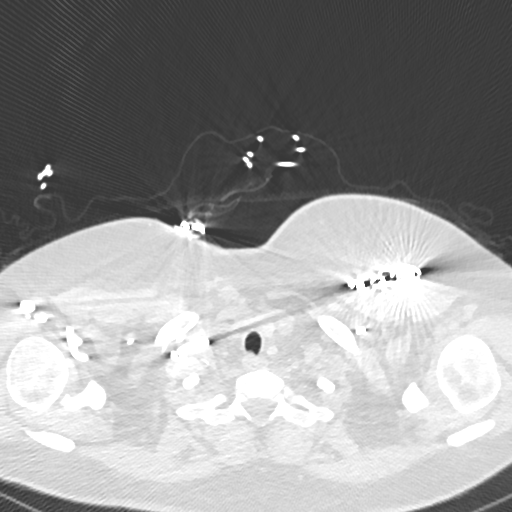

[Series 7: pe 2mm cor · coronal · 0.50mm/px · 1 of 113 slices shown]
[im 57/113  mediastinal]
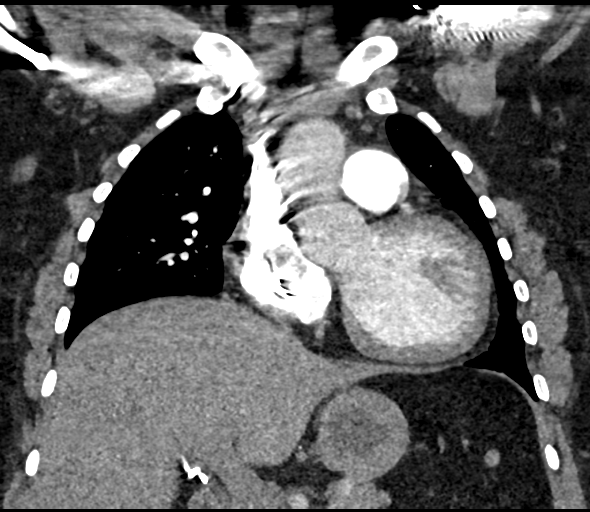

[18 of 36 positions shown; findings below may reference images not displayed]

FINDINGS: Partially visualized thyroid within normal limits. No pathologically
enlarged mediastinal, hilar, or axillary lymph nodes identified.

Intrathoracic aorta of normal caliber and appearance without acute
abnormality. Great vessels within normal limits.

Mild cardiomegaly noted. Left-sided pacemaker in place. No
pericardial effusion.

Pulmonary arteries are adequately opacified for evaluation. The main
pulmonary artery within normal limits for size measuring up to 3 cm
in diameter. There is a single subtle filling defect pulmonary
embolism. No other filling defects to suggest acute PE identified.
The the the Involving a segmental posterior pulmonary artery branch
within the right lower lobe (series 6, image 106 on axial sequence,
series 7, image 86 on coronal sequence). This appears to be
nonocclusive with contrast seen coursing distally. Findings
suspicious for a small nonocclusive pulmonary embolism. No evidence
for right heart dysfunction or right ventricular strain. No other
filling defect identified.

Small layering bilateral pleural effusions with associated
atelectasis present. No overt pulmonary edema. No consolidative
airspace disease. No pneumothorax. No worrisome pulmonary nodule or
mass.

Visualized portions of the upper abdomen demonstrate no acute
abnormality. Small hiatal hernia noted. Patient is status post
cholecystectomy.

Scattered soft tissue emphysema of present within the right breast,
like related to recent surgery. No hematoma or other abnormality.

No acute osseous abnormality. No worrisome lytic or blastic osseous
lesions.
IMPRESSION: 1. Single small filling defect within a right lower lobe segmental
pulmonary artery, suspicious for a small nonocclusive pulmonary
embolism. No evidence for right heart strain.
2. Small bilateral pleural effusions with associated atelectasis.
3. Cardiomegaly without overt pulmonary edema.
4. Scattered soft tissue emphysema within the right breast, likely
related to history of recent surgery.
Critical Value/emergent results were called by telephone at the time
of interpretation on 06/03/2016 at [DATE] to Dr. Recalde, who
verbally acknowledged these results.

## 2017-06-29 DIAGNOSIS — R202 Paresthesia of skin: Secondary | ICD-10-CM | POA: Diagnosis not present

## 2017-06-29 DIAGNOSIS — R42 Dizziness and giddiness: Secondary | ICD-10-CM | POA: Diagnosis not present

## 2017-06-29 DIAGNOSIS — Z885 Allergy status to narcotic agent status: Secondary | ICD-10-CM | POA: Diagnosis not present

## 2017-06-29 DIAGNOSIS — J90 Pleural effusion, not elsewhere classified: Secondary | ICD-10-CM | POA: Diagnosis not present

## 2017-06-29 DIAGNOSIS — Z7901 Long term (current) use of anticoagulants: Secondary | ICD-10-CM | POA: Diagnosis not present

## 2017-06-29 DIAGNOSIS — Z9109 Other allergy status, other than to drugs and biological substances: Secondary | ICD-10-CM | POA: Diagnosis not present

## 2017-06-29 DIAGNOSIS — Z950 Presence of cardiac pacemaker: Secondary | ICD-10-CM | POA: Diagnosis not present

## 2017-06-29 DIAGNOSIS — I509 Heart failure, unspecified: Secondary | ICD-10-CM | POA: Diagnosis not present

## 2017-06-29 DIAGNOSIS — I4891 Unspecified atrial fibrillation: Secondary | ICD-10-CM | POA: Diagnosis not present

## 2017-06-29 DIAGNOSIS — I319 Disease of pericardium, unspecified: Secondary | ICD-10-CM | POA: Diagnosis not present

## 2017-06-29 DIAGNOSIS — I484 Atypical atrial flutter: Secondary | ICD-10-CM | POA: Diagnosis not present

## 2017-06-29 DIAGNOSIS — I517 Cardiomegaly: Secondary | ICD-10-CM | POA: Diagnosis not present

## 2017-06-29 DIAGNOSIS — I442 Atrioventricular block, complete: Secondary | ICD-10-CM | POA: Diagnosis not present

## 2017-06-29 DIAGNOSIS — Z86711 Personal history of pulmonary embolism: Secondary | ICD-10-CM | POA: Diagnosis not present

## 2017-06-29 DIAGNOSIS — R0602 Shortness of breath: Secondary | ICD-10-CM | POA: Diagnosis not present

## 2017-06-29 DIAGNOSIS — Z86718 Personal history of other venous thrombosis and embolism: Secondary | ICD-10-CM | POA: Diagnosis not present

## 2017-06-29 DIAGNOSIS — R0789 Other chest pain: Secondary | ICD-10-CM | POA: Diagnosis not present

## 2017-06-29 DIAGNOSIS — I471 Supraventricular tachycardia: Secondary | ICD-10-CM | POA: Diagnosis not present

## 2017-06-29 DIAGNOSIS — R61 Generalized hyperhidrosis: Secondary | ICD-10-CM | POA: Diagnosis not present

## 2017-06-30 DIAGNOSIS — I481 Persistent atrial fibrillation: Secondary | ICD-10-CM | POA: Diagnosis not present

## 2017-06-30 DIAGNOSIS — I319 Disease of pericardium, unspecified: Secondary | ICD-10-CM | POA: Diagnosis not present

## 2017-10-27 DIAGNOSIS — Z1321 Encounter for screening for nutritional disorder: Secondary | ICD-10-CM | POA: Diagnosis not present

## 2017-10-27 DIAGNOSIS — E039 Hypothyroidism, unspecified: Secondary | ICD-10-CM | POA: Diagnosis not present

## 2017-10-27 DIAGNOSIS — F419 Anxiety disorder, unspecified: Secondary | ICD-10-CM | POA: Diagnosis not present

## 2017-10-27 DIAGNOSIS — Z0001 Encounter for general adult medical examination with abnormal findings: Secondary | ICD-10-CM | POA: Diagnosis not present

## 2017-10-27 DIAGNOSIS — N390 Urinary tract infection, site not specified: Secondary | ICD-10-CM | POA: Diagnosis not present

## 2017-10-27 DIAGNOSIS — I483 Typical atrial flutter: Secondary | ICD-10-CM | POA: Diagnosis not present

## 2017-10-31 DIAGNOSIS — Z0000 Encounter for general adult medical examination without abnormal findings: Secondary | ICD-10-CM | POA: Diagnosis not present

## 2017-10-31 DIAGNOSIS — E039 Hypothyroidism, unspecified: Secondary | ICD-10-CM | POA: Diagnosis not present

## 2017-10-31 DIAGNOSIS — Z23 Encounter for immunization: Secondary | ICD-10-CM | POA: Diagnosis not present

## 2017-10-31 DIAGNOSIS — F329 Major depressive disorder, single episode, unspecified: Secondary | ICD-10-CM | POA: Diagnosis not present

## 2017-10-31 DIAGNOSIS — K219 Gastro-esophageal reflux disease without esophagitis: Secondary | ICD-10-CM | POA: Diagnosis not present

## 2017-11-14 DIAGNOSIS — K219 Gastro-esophageal reflux disease without esophagitis: Secondary | ICD-10-CM | POA: Diagnosis not present

## 2017-11-14 DIAGNOSIS — I483 Typical atrial flutter: Secondary | ICD-10-CM | POA: Diagnosis not present

## 2017-11-14 DIAGNOSIS — E039 Hypothyroidism, unspecified: Secondary | ICD-10-CM | POA: Diagnosis not present

## 2017-11-14 DIAGNOSIS — I2782 Chronic pulmonary embolism: Secondary | ICD-10-CM | POA: Diagnosis not present

## 2018-02-26 DIAGNOSIS — Z45018 Encounter for adjustment and management of other part of cardiac pacemaker: Secondary | ICD-10-CM | POA: Diagnosis not present

## 2018-03-13 DIAGNOSIS — J0110 Acute frontal sinusitis, unspecified: Secondary | ICD-10-CM | POA: Diagnosis not present

## 2018-04-09 ENCOUNTER — Other Ambulatory Visit: Payer: Self-pay | Admitting: Internal Medicine

## 2018-04-09 DIAGNOSIS — Z1231 Encounter for screening mammogram for malignant neoplasm of breast: Secondary | ICD-10-CM

## 2018-05-14 ENCOUNTER — Ambulatory Visit
Admission: RE | Admit: 2018-05-14 | Discharge: 2018-05-14 | Disposition: A | Discharge status: Home / Self Care | Payer: BLUE CROSS/BLUE SHIELD | Source: Ambulatory Visit | Attending: Internal Medicine | Admitting: Internal Medicine

## 2018-05-14 DIAGNOSIS — Z1231 Encounter for screening mammogram for malignant neoplasm of breast: Secondary | ICD-10-CM

## 2018-06-20 DIAGNOSIS — Z950 Presence of cardiac pacemaker: Secondary | ICD-10-CM | POA: Diagnosis not present

## 2018-06-20 DIAGNOSIS — I481 Persistent atrial fibrillation: Secondary | ICD-10-CM | POA: Diagnosis not present

## 2018-06-20 DIAGNOSIS — Z79899 Other long term (current) drug therapy: Secondary | ICD-10-CM | POA: Diagnosis not present

## 2018-06-20 DIAGNOSIS — Z86711 Personal history of pulmonary embolism: Secondary | ICD-10-CM | POA: Diagnosis not present

## 2018-06-20 DIAGNOSIS — Z7901 Long term (current) use of anticoagulants: Secondary | ICD-10-CM | POA: Diagnosis not present

## 2018-06-20 DIAGNOSIS — I442 Atrioventricular block, complete: Secondary | ICD-10-CM | POA: Diagnosis not present

## 2018-06-20 DIAGNOSIS — I499 Cardiac arrhythmia, unspecified: Secondary | ICD-10-CM | POA: Diagnosis not present

## 2018-06-20 DIAGNOSIS — I471 Supraventricular tachycardia: Secondary | ICD-10-CM | POA: Diagnosis not present

## 2018-06-20 DIAGNOSIS — Z6837 Body mass index (BMI) 37.0-37.9, adult: Secondary | ICD-10-CM | POA: Diagnosis not present

## 2018-06-20 DIAGNOSIS — Z86718 Personal history of other venous thrombosis and embolism: Secondary | ICD-10-CM | POA: Diagnosis not present

## 2018-07-27 DIAGNOSIS — Z0000 Encounter for general adult medical examination without abnormal findings: Secondary | ICD-10-CM | POA: Diagnosis not present

## 2018-08-09 DIAGNOSIS — Z23 Encounter for immunization: Secondary | ICD-10-CM | POA: Diagnosis not present

## 2018-08-16 DIAGNOSIS — K219 Gastro-esophageal reflux disease without esophagitis: Secondary | ICD-10-CM | POA: Diagnosis not present

## 2018-08-16 DIAGNOSIS — I483 Typical atrial flutter: Secondary | ICD-10-CM | POA: Diagnosis not present

## 2018-08-16 DIAGNOSIS — M790 Rheumatism, unspecified: Secondary | ICD-10-CM | POA: Diagnosis not present

## 2018-08-16 DIAGNOSIS — R109 Unspecified abdominal pain: Secondary | ICD-10-CM | POA: Diagnosis not present

## 2018-08-16 DIAGNOSIS — E039 Hypothyroidism, unspecified: Secondary | ICD-10-CM | POA: Diagnosis not present

## 2018-08-17 DIAGNOSIS — R740 Nonspecific elevation of levels of transaminase and lactic acid dehydrogenase [LDH]: Secondary | ICD-10-CM | POA: Diagnosis not present

## 2018-08-17 DIAGNOSIS — I2782 Chronic pulmonary embolism: Secondary | ICD-10-CM | POA: Diagnosis not present

## 2018-08-17 DIAGNOSIS — F329 Major depressive disorder, single episode, unspecified: Secondary | ICD-10-CM | POA: Diagnosis not present

## 2018-08-17 DIAGNOSIS — F419 Anxiety disorder, unspecified: Secondary | ICD-10-CM | POA: Diagnosis not present

## 2018-08-20 DIAGNOSIS — Z45018 Encounter for adjustment and management of other part of cardiac pacemaker: Secondary | ICD-10-CM | POA: Diagnosis not present

## 2018-08-20 DIAGNOSIS — I442 Atrioventricular block, complete: Secondary | ICD-10-CM | POA: Diagnosis not present

## 2018-09-04 DIAGNOSIS — J329 Chronic sinusitis, unspecified: Secondary | ICD-10-CM | POA: Diagnosis not present

## 2018-09-04 DIAGNOSIS — J029 Acute pharyngitis, unspecified: Secondary | ICD-10-CM | POA: Diagnosis not present

## 2018-09-28 DIAGNOSIS — R0781 Pleurodynia: Secondary | ICD-10-CM | POA: Diagnosis not present

## 2018-09-28 DIAGNOSIS — J189 Pneumonia, unspecified organism: Secondary | ICD-10-CM | POA: Diagnosis not present

## 2018-09-28 DIAGNOSIS — R0602 Shortness of breath: Secondary | ICD-10-CM | POA: Diagnosis not present

## 2018-10-03 DIAGNOSIS — J189 Pneumonia, unspecified organism: Secondary | ICD-10-CM | POA: Diagnosis not present

## 2018-10-03 DIAGNOSIS — R0602 Shortness of breath: Secondary | ICD-10-CM | POA: Diagnosis not present

## 2018-10-03 DIAGNOSIS — R0781 Pleurodynia: Secondary | ICD-10-CM | POA: Diagnosis not present

## 2018-11-19 DIAGNOSIS — Z45018 Encounter for adjustment and management of other part of cardiac pacemaker: Secondary | ICD-10-CM | POA: Diagnosis not present

## 2019-02-28 DIAGNOSIS — Z20828 Contact with and (suspected) exposure to other viral communicable diseases: Secondary | ICD-10-CM | POA: Diagnosis not present

## 2019-04-08 ENCOUNTER — Other Ambulatory Visit: Payer: Self-pay | Admitting: Internal Medicine

## 2019-04-08 DIAGNOSIS — Z1231 Encounter for screening mammogram for malignant neoplasm of breast: Secondary | ICD-10-CM

## 2019-04-16 ENCOUNTER — Ambulatory Visit (HOSPITAL_COMMUNITY)
Admission: RE | Admit: 2019-04-16 | Discharge: 2019-04-16 | Disposition: A | Discharge status: Home / Self Care | Payer: BC Managed Care – PPO | Source: Ambulatory Visit | Attending: Internal Medicine | Admitting: Internal Medicine

## 2019-04-16 ENCOUNTER — Other Ambulatory Visit (HOSPITAL_COMMUNITY): Payer: Self-pay | Admitting: Internal Medicine

## 2019-04-16 ENCOUNTER — Other Ambulatory Visit: Payer: Self-pay

## 2019-04-16 DIAGNOSIS — E039 Hypothyroidism, unspecified: Secondary | ICD-10-CM | POA: Diagnosis not present

## 2019-04-16 DIAGNOSIS — I82612 Acute embolism and thrombosis of superficial veins of left upper extremity: Secondary | ICD-10-CM | POA: Diagnosis not present

## 2019-04-16 DIAGNOSIS — Z86718 Personal history of other venous thrombosis and embolism: Secondary | ICD-10-CM | POA: Diagnosis not present

## 2019-04-16 DIAGNOSIS — M25512 Pain in left shoulder: Secondary | ICD-10-CM | POA: Diagnosis not present

## 2019-04-16 DIAGNOSIS — R52 Pain, unspecified: Secondary | ICD-10-CM

## 2019-04-16 DIAGNOSIS — Z20828 Contact with and (suspected) exposure to other viral communicable diseases: Secondary | ICD-10-CM | POA: Diagnosis not present

## 2019-04-16 DIAGNOSIS — R0789 Other chest pain: Secondary | ICD-10-CM | POA: Diagnosis not present

## 2019-04-16 DIAGNOSIS — R0609 Other forms of dyspnea: Secondary | ICD-10-CM | POA: Diagnosis not present

## 2019-04-16 DIAGNOSIS — I442 Atrioventricular block, complete: Secondary | ICD-10-CM | POA: Diagnosis not present

## 2019-04-16 DIAGNOSIS — R0600 Dyspnea, unspecified: Secondary | ICD-10-CM | POA: Diagnosis not present

## 2019-04-16 DIAGNOSIS — I484 Atypical atrial flutter: Secondary | ICD-10-CM | POA: Diagnosis not present

## 2019-04-16 DIAGNOSIS — I509 Heart failure, unspecified: Secondary | ICD-10-CM | POA: Diagnosis not present

## 2019-04-16 DIAGNOSIS — Z86711 Personal history of pulmonary embolism: Secondary | ICD-10-CM | POA: Diagnosis not present

## 2019-04-16 DIAGNOSIS — I82A12 Acute embolism and thrombosis of left axillary vein: Secondary | ICD-10-CM | POA: Diagnosis not present

## 2019-04-16 DIAGNOSIS — R079 Chest pain, unspecified: Secondary | ICD-10-CM | POA: Diagnosis not present

## 2019-04-16 DIAGNOSIS — I517 Cardiomegaly: Secondary | ICD-10-CM | POA: Diagnosis not present

## 2019-04-16 DIAGNOSIS — M79622 Pain in left upper arm: Secondary | ICD-10-CM | POA: Diagnosis not present

## 2019-04-16 DIAGNOSIS — J90 Pleural effusion, not elsewhere classified: Secondary | ICD-10-CM | POA: Diagnosis not present

## 2019-04-16 DIAGNOSIS — I4891 Unspecified atrial fibrillation: Secondary | ICD-10-CM | POA: Diagnosis not present

## 2019-04-16 DIAGNOSIS — D72829 Elevated white blood cell count, unspecified: Secondary | ICD-10-CM | POA: Diagnosis not present

## 2019-04-16 DIAGNOSIS — Z7901 Long term (current) use of anticoagulants: Secondary | ICD-10-CM | POA: Diagnosis not present

## 2019-04-16 DIAGNOSIS — D689 Coagulation defect, unspecified: Secondary | ICD-10-CM | POA: Diagnosis not present

## 2019-04-16 DIAGNOSIS — M79602 Pain in left arm: Secondary | ICD-10-CM | POA: Diagnosis not present

## 2019-04-16 DIAGNOSIS — Z79899 Other long term (current) drug therapy: Secondary | ICD-10-CM | POA: Diagnosis not present

## 2019-04-16 NOTE — Progress Notes (Signed)
Left upper extremity venous duplex has been completed. Preliminary results can be found in CV Proc through chart review.  Results were given to Vassie Moment PA.   04/16/19 4:35 PM Carlos Levering RVT

## 2019-04-22 DIAGNOSIS — Z86711 Personal history of pulmonary embolism: Secondary | ICD-10-CM | POA: Diagnosis not present

## 2019-04-22 DIAGNOSIS — I82A12 Acute embolism and thrombosis of left axillary vein: Secondary | ICD-10-CM | POA: Diagnosis not present

## 2019-05-03 DIAGNOSIS — H40013 Open angle with borderline findings, low risk, bilateral: Secondary | ICD-10-CM | POA: Diagnosis not present

## 2019-05-23 ENCOUNTER — Other Ambulatory Visit: Payer: Self-pay

## 2019-05-23 ENCOUNTER — Ambulatory Visit
Admission: RE | Admit: 2019-05-23 | Discharge: 2019-05-23 | Disposition: A | Discharge status: Home / Self Care | Payer: BC Managed Care – PPO | Source: Ambulatory Visit | Attending: Internal Medicine | Admitting: Internal Medicine

## 2019-05-23 DIAGNOSIS — Z1231 Encounter for screening mammogram for malignant neoplasm of breast: Secondary | ICD-10-CM

## 2019-06-10 DIAGNOSIS — R51 Headache: Secondary | ICD-10-CM | POA: Diagnosis not present

## 2019-06-10 DIAGNOSIS — R509 Fever, unspecified: Secondary | ICD-10-CM | POA: Diagnosis not present

## 2019-06-10 DIAGNOSIS — Z20828 Contact with and (suspected) exposure to other viral communicable diseases: Secondary | ICD-10-CM | POA: Diagnosis not present

## 2019-06-10 DIAGNOSIS — R05 Cough: Secondary | ICD-10-CM | POA: Diagnosis not present

## 2019-06-17 DIAGNOSIS — Z86711 Personal history of pulmonary embolism: Secondary | ICD-10-CM | POA: Diagnosis not present

## 2019-06-17 DIAGNOSIS — I4819 Other persistent atrial fibrillation: Secondary | ICD-10-CM | POA: Diagnosis not present

## 2019-06-17 DIAGNOSIS — Z86718 Personal history of other venous thrombosis and embolism: Secondary | ICD-10-CM | POA: Diagnosis not present

## 2019-06-17 DIAGNOSIS — Z45018 Encounter for adjustment and management of other part of cardiac pacemaker: Secondary | ICD-10-CM | POA: Diagnosis not present

## 2019-06-17 DIAGNOSIS — Z950 Presence of cardiac pacemaker: Secondary | ICD-10-CM | POA: Diagnosis not present

## 2019-06-17 DIAGNOSIS — Z7901 Long term (current) use of anticoagulants: Secondary | ICD-10-CM | POA: Diagnosis not present

## 2019-06-17 DIAGNOSIS — Z79899 Other long term (current) drug therapy: Secondary | ICD-10-CM | POA: Diagnosis not present

## 2019-06-17 DIAGNOSIS — I442 Atrioventricular block, complete: Secondary | ICD-10-CM | POA: Diagnosis not present

## 2019-07-02 DIAGNOSIS — I484 Atypical atrial flutter: Secondary | ICD-10-CM | POA: Diagnosis not present

## 2019-07-02 DIAGNOSIS — I442 Atrioventricular block, complete: Secondary | ICD-10-CM | POA: Diagnosis not present

## 2019-07-02 DIAGNOSIS — R52 Pain, unspecified: Secondary | ICD-10-CM | POA: Diagnosis not present

## 2019-07-02 DIAGNOSIS — E039 Hypothyroidism, unspecified: Secondary | ICD-10-CM | POA: Diagnosis not present

## 2019-07-02 DIAGNOSIS — Z79899 Other long term (current) drug therapy: Secondary | ICD-10-CM | POA: Diagnosis not present

## 2019-07-02 DIAGNOSIS — I4819 Other persistent atrial fibrillation: Secondary | ICD-10-CM | POA: Diagnosis not present

## 2019-07-02 DIAGNOSIS — Z45010 Encounter for checking and testing of cardiac pacemaker pulse generator [battery]: Secondary | ICD-10-CM | POA: Diagnosis not present

## 2019-07-02 DIAGNOSIS — I10 Essential (primary) hypertension: Secondary | ICD-10-CM | POA: Diagnosis not present

## 2019-07-02 DIAGNOSIS — F329 Major depressive disorder, single episode, unspecified: Secondary | ICD-10-CM | POA: Diagnosis not present

## 2019-07-11 DIAGNOSIS — I442 Atrioventricular block, complete: Secondary | ICD-10-CM | POA: Diagnosis not present

## 2019-08-19 DIAGNOSIS — I471 Supraventricular tachycardia: Secondary | ICD-10-CM | POA: Diagnosis not present

## 2019-08-19 DIAGNOSIS — I442 Atrioventricular block, complete: Secondary | ICD-10-CM | POA: Diagnosis not present

## 2019-08-19 DIAGNOSIS — I484 Atypical atrial flutter: Secondary | ICD-10-CM | POA: Diagnosis not present

## 2019-08-19 DIAGNOSIS — Z79899 Other long term (current) drug therapy: Secondary | ICD-10-CM | POA: Diagnosis not present

## 2019-08-19 DIAGNOSIS — Z950 Presence of cardiac pacemaker: Secondary | ICD-10-CM | POA: Diagnosis not present

## 2019-08-19 DIAGNOSIS — I4891 Unspecified atrial fibrillation: Secondary | ICD-10-CM | POA: Diagnosis not present

## 2019-08-19 DIAGNOSIS — E039 Hypothyroidism, unspecified: Secondary | ICD-10-CM | POA: Diagnosis not present

## 2019-08-19 DIAGNOSIS — I452 Bifascicular block: Secondary | ICD-10-CM | POA: Diagnosis not present

## 2019-08-19 DIAGNOSIS — Z86711 Personal history of pulmonary embolism: Secondary | ICD-10-CM | POA: Diagnosis not present

## 2019-08-19 DIAGNOSIS — Z7901 Long term (current) use of anticoagulants: Secondary | ICD-10-CM | POA: Diagnosis not present

## 2019-08-19 DIAGNOSIS — Z86718 Personal history of other venous thrombosis and embolism: Secondary | ICD-10-CM | POA: Diagnosis not present

## 2019-08-19 DIAGNOSIS — R9431 Abnormal electrocardiogram [ECG] [EKG]: Secondary | ICD-10-CM | POA: Diagnosis not present

## 2019-08-19 DIAGNOSIS — Z8679 Personal history of other diseases of the circulatory system: Secondary | ICD-10-CM | POA: Diagnosis not present

## 2019-08-19 DIAGNOSIS — I495 Sick sinus syndrome: Secondary | ICD-10-CM | POA: Diagnosis not present

## 2019-08-22 DIAGNOSIS — R7401 Elevation of levels of liver transaminase levels: Secondary | ICD-10-CM | POA: Diagnosis not present

## 2019-08-22 DIAGNOSIS — R5383 Other fatigue: Secondary | ICD-10-CM | POA: Diagnosis not present

## 2019-08-22 DIAGNOSIS — R7402 Elevation of levels of lactic acid dehydrogenase (LDH): Secondary | ICD-10-CM | POA: Diagnosis not present

## 2019-08-22 DIAGNOSIS — Z0000 Encounter for general adult medical examination without abnormal findings: Secondary | ICD-10-CM | POA: Diagnosis not present

## 2019-08-29 DIAGNOSIS — I82A12 Acute embolism and thrombosis of left axillary vein: Secondary | ICD-10-CM | POA: Diagnosis not present

## 2019-08-29 DIAGNOSIS — E039 Hypothyroidism, unspecified: Secondary | ICD-10-CM | POA: Diagnosis not present

## 2019-08-29 DIAGNOSIS — Z0000 Encounter for general adult medical examination without abnormal findings: Secondary | ICD-10-CM | POA: Diagnosis not present

## 2019-08-29 DIAGNOSIS — I2782 Chronic pulmonary embolism: Secondary | ICD-10-CM | POA: Diagnosis not present

## 2019-08-29 DIAGNOSIS — Z23 Encounter for immunization: Secondary | ICD-10-CM | POA: Diagnosis not present

## 2019-09-06 ENCOUNTER — Other Ambulatory Visit: Payer: Self-pay | Admitting: Internal Medicine

## 2019-09-10 ENCOUNTER — Other Ambulatory Visit: Payer: Self-pay | Admitting: Internal Medicine

## 2019-09-10 DIAGNOSIS — I82A12 Acute embolism and thrombosis of left axillary vein: Secondary | ICD-10-CM

## 2019-10-03 DIAGNOSIS — Z45018 Encounter for adjustment and management of other part of cardiac pacemaker: Secondary | ICD-10-CM | POA: Diagnosis not present

## 2019-10-03 DIAGNOSIS — I442 Atrioventricular block, complete: Secondary | ICD-10-CM | POA: Diagnosis not present

## 2019-11-04 ENCOUNTER — Ambulatory Visit
Admission: RE | Admit: 2019-11-04 | Discharge: 2019-11-04 | Disposition: A | Discharge status: Home / Self Care | Payer: BC Managed Care – PPO | Source: Ambulatory Visit | Attending: Internal Medicine | Admitting: Internal Medicine

## 2019-11-04 DIAGNOSIS — I82622 Acute embolism and thrombosis of deep veins of left upper extremity: Secondary | ICD-10-CM | POA: Diagnosis not present

## 2019-11-04 DIAGNOSIS — I82A12 Acute embolism and thrombosis of left axillary vein: Secondary | ICD-10-CM

## 2020-01-02 DIAGNOSIS — Z45018 Encounter for adjustment and management of other part of cardiac pacemaker: Secondary | ICD-10-CM | POA: Diagnosis not present

## 2020-01-02 DIAGNOSIS — I442 Atrioventricular block, complete: Secondary | ICD-10-CM | POA: Diagnosis not present

## 2020-01-17 ENCOUNTER — Ambulatory Visit: Payer: BC Managed Care – PPO | Attending: Internal Medicine

## 2020-01-17 DIAGNOSIS — Z23 Encounter for immunization: Secondary | ICD-10-CM

## 2020-02-03 DIAGNOSIS — M542 Cervicalgia: Secondary | ICD-10-CM | POA: Diagnosis not present

## 2020-02-03 DIAGNOSIS — S161XXA Strain of muscle, fascia and tendon at neck level, initial encounter: Secondary | ICD-10-CM | POA: Diagnosis not present

## 2020-02-11 ENCOUNTER — Ambulatory Visit: Payer: BC Managed Care – PPO | Attending: Internal Medicine

## 2020-02-11 DIAGNOSIS — Z23 Encounter for immunization: Secondary | ICD-10-CM

## 2020-02-11 NOTE — Progress Notes (Signed)
   Covid-19 Vaccination Clinic  Name:  [name]    MRN: [id number] DOB: 03/17/1976  02/11/2020  Ms. [name] was observed post Covid-19 immunization for 15 minutes without incident. She was provided with Vaccine Information Sheet and instruction to access the V-Safe system.   Ms. [name] was instructed to call 911 with any severe reactions post vaccine: Marland Kitchen Difficulty breathing  . Swelling of face and throat  . A fast heartbeat  . A bad rash all over body  . Dizziness and weakness   Immunizations Administered    Name Date Dose VIS Date Route   Pfizer COVID-19 Vaccine 02/11/2020 11:39 AM 0.3 mL 12/18/2018 Intramuscular   Manufacturer: [place]   Lot: [id number]   [place]: [id number]

## 2020-03-06 DIAGNOSIS — I2782 Chronic pulmonary embolism: Secondary | ICD-10-CM | POA: Diagnosis not present

## 2020-03-06 DIAGNOSIS — I82A12 Acute embolism and thrombosis of left axillary vein: Secondary | ICD-10-CM | POA: Diagnosis not present

## 2020-03-06 DIAGNOSIS — E039 Hypothyroidism, unspecified: Secondary | ICD-10-CM | POA: Diagnosis not present

## 2020-03-06 DIAGNOSIS — Z0000 Encounter for general adult medical examination without abnormal findings: Secondary | ICD-10-CM | POA: Diagnosis not present

## 2020-03-10 DIAGNOSIS — E039 Hypothyroidism, unspecified: Secondary | ICD-10-CM | POA: Diagnosis not present

## 2020-03-10 DIAGNOSIS — I2782 Chronic pulmonary embolism: Secondary | ICD-10-CM | POA: Diagnosis not present

## 2020-03-10 DIAGNOSIS — I82A12 Acute embolism and thrombosis of left axillary vein: Secondary | ICD-10-CM | POA: Diagnosis not present

## 2020-03-10 DIAGNOSIS — K219 Gastro-esophageal reflux disease without esophagitis: Secondary | ICD-10-CM | POA: Diagnosis not present

## 2020-04-02 DIAGNOSIS — I442 Atrioventricular block, complete: Secondary | ICD-10-CM | POA: Diagnosis not present

## 2020-04-02 DIAGNOSIS — I471 Supraventricular tachycardia: Secondary | ICD-10-CM | POA: Diagnosis not present

## 2020-04-02 DIAGNOSIS — Z45018 Encounter for adjustment and management of other part of cardiac pacemaker: Secondary | ICD-10-CM | POA: Diagnosis not present

## 2020-04-22 ENCOUNTER — Other Ambulatory Visit: Payer: Self-pay | Admitting: Internal Medicine

## 2020-04-22 DIAGNOSIS — Z1231 Encounter for screening mammogram for malignant neoplasm of breast: Secondary | ICD-10-CM

## 2020-05-25 ENCOUNTER — Other Ambulatory Visit: Payer: Self-pay

## 2020-05-25 ENCOUNTER — Ambulatory Visit
Admission: RE | Admit: 2020-05-25 | Discharge: 2020-05-25 | Disposition: A | Discharge status: Home / Self Care | Payer: BC Managed Care – PPO | Source: Ambulatory Visit | Attending: Internal Medicine | Admitting: Internal Medicine

## 2020-05-25 DIAGNOSIS — Z1231 Encounter for screening mammogram for malignant neoplasm of breast: Secondary | ICD-10-CM

## 2020-07-02 DIAGNOSIS — Z45018 Encounter for adjustment and management of other part of cardiac pacemaker: Secondary | ICD-10-CM | POA: Diagnosis not present

## 2020-07-07 DIAGNOSIS — Z20828 Contact with and (suspected) exposure to other viral communicable diseases: Secondary | ICD-10-CM | POA: Diagnosis not present

## 2020-09-03 DIAGNOSIS — K219 Gastro-esophageal reflux disease without esophagitis: Secondary | ICD-10-CM | POA: Diagnosis not present

## 2020-09-03 DIAGNOSIS — E039 Hypothyroidism, unspecified: Secondary | ICD-10-CM | POA: Diagnosis not present

## 2020-09-03 DIAGNOSIS — I2782 Chronic pulmonary embolism: Secondary | ICD-10-CM | POA: Diagnosis not present

## 2020-09-03 DIAGNOSIS — Z13220 Encounter for screening for lipoid disorders: Secondary | ICD-10-CM | POA: Diagnosis not present

## 2020-09-08 DIAGNOSIS — Z23 Encounter for immunization: Secondary | ICD-10-CM | POA: Diagnosis not present

## 2020-09-08 DIAGNOSIS — Z0000 Encounter for general adult medical examination without abnormal findings: Secondary | ICD-10-CM | POA: Diagnosis not present

## 2020-09-08 DIAGNOSIS — E039 Hypothyroidism, unspecified: Secondary | ICD-10-CM | POA: Diagnosis not present

## 2020-09-08 DIAGNOSIS — I483 Typical atrial flutter: Secondary | ICD-10-CM | POA: Diagnosis not present

## 2020-09-08 DIAGNOSIS — K219 Gastro-esophageal reflux disease without esophagitis: Secondary | ICD-10-CM | POA: Diagnosis not present

## 2020-10-01 DIAGNOSIS — Z45018 Encounter for adjustment and management of other part of cardiac pacemaker: Secondary | ICD-10-CM | POA: Diagnosis not present

## 2020-11-01 DIAGNOSIS — Z20828 Contact with and (suspected) exposure to other viral communicable diseases: Secondary | ICD-10-CM | POA: Diagnosis not present

## 2020-12-14 DIAGNOSIS — I4819 Other persistent atrial fibrillation: Secondary | ICD-10-CM | POA: Diagnosis not present

## 2020-12-14 DIAGNOSIS — F32A Depression, unspecified: Secondary | ICD-10-CM | POA: Diagnosis not present

## 2020-12-14 DIAGNOSIS — I471 Supraventricular tachycardia: Secondary | ICD-10-CM | POA: Diagnosis not present

## 2020-12-14 DIAGNOSIS — Z45018 Encounter for adjustment and management of other part of cardiac pacemaker: Secondary | ICD-10-CM | POA: Diagnosis not present

## 2020-12-14 DIAGNOSIS — I82409 Acute embolism and thrombosis of unspecified deep veins of unspecified lower extremity: Secondary | ICD-10-CM | POA: Diagnosis not present

## 2020-12-14 DIAGNOSIS — D682 Hereditary deficiency of other clotting factors: Secondary | ICD-10-CM | POA: Diagnosis not present

## 2020-12-14 DIAGNOSIS — E079 Disorder of thyroid, unspecified: Secondary | ICD-10-CM | POA: Diagnosis not present

## 2020-12-14 DIAGNOSIS — Z6834 Body mass index (BMI) 34.0-34.9, adult: Secondary | ICD-10-CM | POA: Diagnosis not present

## 2020-12-14 DIAGNOSIS — E039 Hypothyroidism, unspecified: Secondary | ICD-10-CM | POA: Diagnosis not present

## 2020-12-14 DIAGNOSIS — I319 Disease of pericardium, unspecified: Secondary | ICD-10-CM | POA: Diagnosis not present

## 2020-12-14 DIAGNOSIS — I82B12 Acute embolism and thrombosis of left subclavian vein: Secondary | ICD-10-CM | POA: Diagnosis not present

## 2020-12-14 DIAGNOSIS — I442 Atrioventricular block, complete: Secondary | ICD-10-CM | POA: Diagnosis not present

## 2020-12-31 DIAGNOSIS — Z45018 Encounter for adjustment and management of other part of cardiac pacemaker: Secondary | ICD-10-CM | POA: Diagnosis not present

## 2021-02-22 DIAGNOSIS — I4891 Unspecified atrial fibrillation: Secondary | ICD-10-CM | POA: Diagnosis not present

## 2021-02-22 DIAGNOSIS — Z45018 Encounter for adjustment and management of other part of cardiac pacemaker: Secondary | ICD-10-CM | POA: Diagnosis not present

## 2021-02-26 DIAGNOSIS — Z79890 Hormone replacement therapy (postmenopausal): Secondary | ICD-10-CM | POA: Diagnosis not present

## 2021-02-26 DIAGNOSIS — Z7901 Long term (current) use of anticoagulants: Secondary | ICD-10-CM | POA: Diagnosis not present

## 2021-02-26 DIAGNOSIS — R9431 Abnormal electrocardiogram [ECG] [EKG]: Secondary | ICD-10-CM | POA: Diagnosis not present

## 2021-02-26 DIAGNOSIS — I4891 Unspecified atrial fibrillation: Secondary | ICD-10-CM | POA: Diagnosis not present

## 2021-02-26 DIAGNOSIS — Z885 Allergy status to narcotic agent status: Secondary | ICD-10-CM | POA: Diagnosis not present

## 2021-02-26 DIAGNOSIS — Z79899 Other long term (current) drug therapy: Secondary | ICD-10-CM | POA: Diagnosis not present

## 2021-03-31 ENCOUNTER — Other Ambulatory Visit: Payer: Self-pay | Admitting: Internal Medicine

## 2021-03-31 DIAGNOSIS — Z1231 Encounter for screening mammogram for malignant neoplasm of breast: Secondary | ICD-10-CM

## 2021-04-01 DIAGNOSIS — I442 Atrioventricular block, complete: Secondary | ICD-10-CM | POA: Diagnosis not present

## 2021-04-01 DIAGNOSIS — Z45018 Encounter for adjustment and management of other part of cardiac pacemaker: Secondary | ICD-10-CM | POA: Diagnosis not present

## 2021-05-26 ENCOUNTER — Inpatient Hospital Stay: Admission: RE | Admit: 2021-05-26 | Payer: BC Managed Care – PPO | Source: Ambulatory Visit

## 2021-05-27 DIAGNOSIS — Z6834 Body mass index (BMI) 34.0-34.9, adult: Secondary | ICD-10-CM | POA: Diagnosis not present

## 2021-05-27 DIAGNOSIS — Z1211 Encounter for screening for malignant neoplasm of colon: Secondary | ICD-10-CM | POA: Diagnosis not present

## 2021-05-29 ENCOUNTER — Ambulatory Visit
Admission: RE | Admit: 2021-05-29 | Discharge: 2021-05-29 | Disposition: A | Discharge status: Home / Self Care | Payer: BC Managed Care – PPO | Source: Ambulatory Visit | Attending: Internal Medicine | Admitting: Internal Medicine

## 2021-05-29 ENCOUNTER — Other Ambulatory Visit: Payer: Self-pay

## 2021-05-29 DIAGNOSIS — Z1231 Encounter for screening mammogram for malignant neoplasm of breast: Secondary | ICD-10-CM

## 2021-05-31 ENCOUNTER — Other Ambulatory Visit: Payer: Self-pay | Admitting: Internal Medicine

## 2021-05-31 DIAGNOSIS — N63 Unspecified lump in unspecified breast: Secondary | ICD-10-CM

## 2021-06-03 ENCOUNTER — Other Ambulatory Visit: Payer: Self-pay

## 2021-06-03 ENCOUNTER — Ambulatory Visit
Admission: RE | Admit: 2021-06-03 | Discharge: 2021-06-03 | Disposition: A | Discharge status: Home / Self Care | Payer: BC Managed Care – PPO | Source: Ambulatory Visit | Attending: Internal Medicine | Admitting: Internal Medicine

## 2021-06-03 DIAGNOSIS — N63 Unspecified lump in unspecified breast: Secondary | ICD-10-CM

## 2021-06-03 DIAGNOSIS — R922 Inconclusive mammogram: Secondary | ICD-10-CM | POA: Diagnosis not present

## 2021-07-01 DIAGNOSIS — Z45018 Encounter for adjustment and management of other part of cardiac pacemaker: Secondary | ICD-10-CM | POA: Diagnosis not present

## 2021-07-29 DIAGNOSIS — Z45018 Encounter for adjustment and management of other part of cardiac pacemaker: Secondary | ICD-10-CM | POA: Diagnosis not present

## 2021-08-03 DIAGNOSIS — Z7901 Long term (current) use of anticoagulants: Secondary | ICD-10-CM | POA: Diagnosis not present

## 2021-08-03 DIAGNOSIS — R9431 Abnormal electrocardiogram [ECG] [EKG]: Secondary | ICD-10-CM | POA: Diagnosis not present

## 2021-08-03 DIAGNOSIS — I4891 Unspecified atrial fibrillation: Secondary | ICD-10-CM | POA: Diagnosis not present

## 2021-08-03 DIAGNOSIS — Z950 Presence of cardiac pacemaker: Secondary | ICD-10-CM | POA: Diagnosis not present

## 2021-08-05 DIAGNOSIS — I4891 Unspecified atrial fibrillation: Secondary | ICD-10-CM | POA: Diagnosis not present

## 2021-08-09 DIAGNOSIS — E039 Hypothyroidism, unspecified: Secondary | ICD-10-CM | POA: Diagnosis not present

## 2021-08-09 DIAGNOSIS — K449 Diaphragmatic hernia without obstruction or gangrene: Secondary | ICD-10-CM | POA: Diagnosis not present

## 2021-08-09 DIAGNOSIS — R079 Chest pain, unspecified: Secondary | ICD-10-CM | POA: Diagnosis not present

## 2021-08-09 DIAGNOSIS — I4891 Unspecified atrial fibrillation: Secondary | ICD-10-CM | POA: Diagnosis not present

## 2021-08-09 DIAGNOSIS — E079 Disorder of thyroid, unspecified: Secondary | ICD-10-CM | POA: Diagnosis not present

## 2021-08-09 DIAGNOSIS — R918 Other nonspecific abnormal finding of lung field: Secondary | ICD-10-CM | POA: Diagnosis not present

## 2021-08-09 DIAGNOSIS — I442 Atrioventricular block, complete: Secondary | ICD-10-CM | POA: Diagnosis not present

## 2021-08-09 DIAGNOSIS — Z79890 Hormone replacement therapy (postmenopausal): Secondary | ICD-10-CM | POA: Diagnosis not present

## 2021-08-09 DIAGNOSIS — I484 Atypical atrial flutter: Secondary | ICD-10-CM | POA: Diagnosis not present

## 2021-08-09 DIAGNOSIS — R9431 Abnormal electrocardiogram [ECG] [EKG]: Secondary | ICD-10-CM | POA: Diagnosis not present

## 2021-08-09 DIAGNOSIS — Z86718 Personal history of other venous thrombosis and embolism: Secondary | ICD-10-CM | POA: Diagnosis not present

## 2021-08-09 DIAGNOSIS — R0602 Shortness of breath: Secondary | ICD-10-CM | POA: Diagnosis not present

## 2021-08-09 DIAGNOSIS — J9811 Atelectasis: Secondary | ICD-10-CM | POA: Diagnosis not present

## 2021-08-09 DIAGNOSIS — R0789 Other chest pain: Secondary | ICD-10-CM | POA: Diagnosis not present

## 2021-08-09 DIAGNOSIS — I471 Supraventricular tachycardia: Secondary | ICD-10-CM | POA: Diagnosis not present

## 2021-08-09 DIAGNOSIS — D689 Coagulation defect, unspecified: Secondary | ICD-10-CM | POA: Diagnosis not present

## 2021-08-09 DIAGNOSIS — Z79899 Other long term (current) drug therapy: Secondary | ICD-10-CM | POA: Diagnosis not present

## 2021-08-09 DIAGNOSIS — Z7901 Long term (current) use of anticoagulants: Secondary | ICD-10-CM | POA: Diagnosis not present

## 2021-08-10 DIAGNOSIS — R079 Chest pain, unspecified: Secondary | ICD-10-CM | POA: Diagnosis not present

## 2021-08-11 DIAGNOSIS — I4891 Unspecified atrial fibrillation: Secondary | ICD-10-CM | POA: Diagnosis not present

## 2021-08-11 DIAGNOSIS — Z79890 Hormone replacement therapy (postmenopausal): Secondary | ICD-10-CM | POA: Diagnosis not present

## 2021-08-11 DIAGNOSIS — Z45018 Encounter for adjustment and management of other part of cardiac pacemaker: Secondary | ICD-10-CM | POA: Diagnosis not present

## 2021-08-11 DIAGNOSIS — I4819 Other persistent atrial fibrillation: Secondary | ICD-10-CM | POA: Diagnosis not present

## 2021-08-11 DIAGNOSIS — Z7901 Long term (current) use of anticoagulants: Secondary | ICD-10-CM | POA: Diagnosis not present

## 2021-08-11 DIAGNOSIS — E039 Hypothyroidism, unspecified: Secondary | ICD-10-CM | POA: Diagnosis not present

## 2021-08-11 DIAGNOSIS — I442 Atrioventricular block, complete: Secondary | ICD-10-CM | POA: Diagnosis not present

## 2021-08-12 DIAGNOSIS — I442 Atrioventricular block, complete: Secondary | ICD-10-CM | POA: Diagnosis not present

## 2021-08-12 DIAGNOSIS — I4891 Unspecified atrial fibrillation: Secondary | ICD-10-CM | POA: Diagnosis not present

## 2021-08-20 DIAGNOSIS — I4891 Unspecified atrial fibrillation: Secondary | ICD-10-CM | POA: Insufficient documentation

## 2021-09-27 DIAGNOSIS — I4891 Unspecified atrial fibrillation: Secondary | ICD-10-CM | POA: Diagnosis not present

## 2021-09-28 DIAGNOSIS — I2699 Other pulmonary embolism without acute cor pulmonale: Secondary | ICD-10-CM | POA: Diagnosis not present

## 2021-09-28 DIAGNOSIS — I4819 Other persistent atrial fibrillation: Secondary | ICD-10-CM | POA: Diagnosis not present

## 2021-09-28 DIAGNOSIS — Z79899 Other long term (current) drug therapy: Secondary | ICD-10-CM | POA: Diagnosis not present

## 2021-09-28 DIAGNOSIS — I484 Atypical atrial flutter: Secondary | ICD-10-CM | POA: Diagnosis not present

## 2021-09-28 DIAGNOSIS — I071 Rheumatic tricuspid insufficiency: Secondary | ICD-10-CM | POA: Diagnosis not present

## 2021-09-28 DIAGNOSIS — I442 Atrioventricular block, complete: Secondary | ICD-10-CM | POA: Diagnosis not present

## 2021-09-28 DIAGNOSIS — F32A Depression, unspecified: Secondary | ICD-10-CM | POA: Diagnosis not present

## 2021-09-28 DIAGNOSIS — I4891 Unspecified atrial fibrillation: Secondary | ICD-10-CM | POA: Diagnosis not present

## 2021-09-28 DIAGNOSIS — I319 Disease of pericardium, unspecified: Secondary | ICD-10-CM | POA: Diagnosis not present

## 2021-09-28 DIAGNOSIS — Z20822 Contact with and (suspected) exposure to covid-19: Secondary | ICD-10-CM | POA: Diagnosis not present

## 2021-09-28 DIAGNOSIS — Z950 Presence of cardiac pacemaker: Secondary | ICD-10-CM | POA: Diagnosis not present

## 2021-09-29 DIAGNOSIS — I071 Rheumatic tricuspid insufficiency: Secondary | ICD-10-CM | POA: Diagnosis not present

## 2021-09-29 DIAGNOSIS — I4891 Unspecified atrial fibrillation: Secondary | ICD-10-CM | POA: Diagnosis not present

## 2021-09-29 DIAGNOSIS — Z20822 Contact with and (suspected) exposure to covid-19: Secondary | ICD-10-CM | POA: Diagnosis not present

## 2021-09-29 DIAGNOSIS — I517 Cardiomegaly: Secondary | ICD-10-CM | POA: Diagnosis not present

## 2021-09-29 DIAGNOSIS — I484 Atypical atrial flutter: Secondary | ICD-10-CM | POA: Diagnosis not present

## 2021-09-29 DIAGNOSIS — I4819 Other persistent atrial fibrillation: Secondary | ICD-10-CM | POA: Diagnosis not present

## 2021-09-29 DIAGNOSIS — I319 Disease of pericardium, unspecified: Secondary | ICD-10-CM | POA: Diagnosis not present

## 2021-09-29 DIAGNOSIS — I2699 Other pulmonary embolism without acute cor pulmonale: Secondary | ICD-10-CM | POA: Diagnosis not present

## 2021-09-29 DIAGNOSIS — Z45018 Encounter for adjustment and management of other part of cardiac pacemaker: Secondary | ICD-10-CM | POA: Diagnosis not present

## 2021-09-29 DIAGNOSIS — F32A Depression, unspecified: Secondary | ICD-10-CM | POA: Diagnosis not present

## 2021-09-29 DIAGNOSIS — I308 Other forms of acute pericarditis: Secondary | ICD-10-CM | POA: Diagnosis not present

## 2021-09-29 DIAGNOSIS — I442 Atrioventricular block, complete: Secondary | ICD-10-CM | POA: Diagnosis not present

## 2021-09-29 DIAGNOSIS — Z79899 Other long term (current) drug therapy: Secondary | ICD-10-CM | POA: Diagnosis not present

## 2021-09-30 DIAGNOSIS — I319 Disease of pericardium, unspecified: Secondary | ICD-10-CM | POA: Diagnosis not present

## 2021-09-30 DIAGNOSIS — I484 Atypical atrial flutter: Secondary | ICD-10-CM | POA: Diagnosis not present

## 2021-09-30 DIAGNOSIS — I4891 Unspecified atrial fibrillation: Secondary | ICD-10-CM | POA: Diagnosis not present

## 2021-09-30 DIAGNOSIS — I442 Atrioventricular block, complete: Secondary | ICD-10-CM | POA: Diagnosis not present

## 2021-09-30 DIAGNOSIS — F32A Depression, unspecified: Secondary | ICD-10-CM | POA: Diagnosis not present

## 2021-09-30 DIAGNOSIS — Z79899 Other long term (current) drug therapy: Secondary | ICD-10-CM | POA: Diagnosis not present

## 2021-09-30 DIAGNOSIS — I2699 Other pulmonary embolism without acute cor pulmonale: Secondary | ICD-10-CM | POA: Diagnosis not present

## 2021-09-30 DIAGNOSIS — I4819 Other persistent atrial fibrillation: Secondary | ICD-10-CM | POA: Diagnosis not present

## 2021-09-30 DIAGNOSIS — Z20822 Contact with and (suspected) exposure to covid-19: Secondary | ICD-10-CM | POA: Diagnosis not present

## 2021-09-30 DIAGNOSIS — I308 Other forms of acute pericarditis: Secondary | ICD-10-CM | POA: Diagnosis not present

## 2021-09-30 DIAGNOSIS — I071 Rheumatic tricuspid insufficiency: Secondary | ICD-10-CM | POA: Diagnosis not present

## 2021-10-03 DIAGNOSIS — I4891 Unspecified atrial fibrillation: Secondary | ICD-10-CM | POA: Diagnosis not present

## 2021-10-04 DIAGNOSIS — I82622 Acute embolism and thrombosis of deep veins of left upper extremity: Secondary | ICD-10-CM | POA: Diagnosis not present

## 2021-10-04 DIAGNOSIS — I495 Sick sinus syndrome: Secondary | ICD-10-CM | POA: Diagnosis not present

## 2021-10-04 DIAGNOSIS — I442 Atrioventricular block, complete: Secondary | ICD-10-CM | POA: Diagnosis not present

## 2021-10-04 DIAGNOSIS — E079 Disorder of thyroid, unspecified: Secondary | ICD-10-CM | POA: Diagnosis not present

## 2021-10-04 DIAGNOSIS — Z6834 Body mass index (BMI) 34.0-34.9, adult: Secondary | ICD-10-CM | POA: Diagnosis not present

## 2021-10-04 DIAGNOSIS — I471 Supraventricular tachycardia: Secondary | ICD-10-CM | POA: Diagnosis not present

## 2021-10-04 DIAGNOSIS — I4819 Other persistent atrial fibrillation: Secondary | ICD-10-CM | POA: Diagnosis not present

## 2021-10-04 DIAGNOSIS — Z823 Family history of stroke: Secondary | ICD-10-CM | POA: Diagnosis not present

## 2021-10-04 DIAGNOSIS — Z7901 Long term (current) use of anticoagulants: Secondary | ICD-10-CM | POA: Diagnosis not present

## 2021-10-04 DIAGNOSIS — I319 Disease of pericardium, unspecified: Secondary | ICD-10-CM | POA: Diagnosis not present

## 2021-10-04 DIAGNOSIS — Z950 Presence of cardiac pacemaker: Secondary | ICD-10-CM | POA: Diagnosis not present

## 2021-10-04 DIAGNOSIS — I4892 Unspecified atrial flutter: Secondary | ICD-10-CM | POA: Diagnosis not present

## 2021-10-07 DIAGNOSIS — I471 Supraventricular tachycardia: Secondary | ICD-10-CM | POA: Diagnosis not present

## 2021-10-07 DIAGNOSIS — I442 Atrioventricular block, complete: Secondary | ICD-10-CM | POA: Diagnosis not present

## 2021-12-06 DIAGNOSIS — R9431 Abnormal electrocardiogram [ECG] [EKG]: Secondary | ICD-10-CM | POA: Diagnosis not present

## 2021-12-06 DIAGNOSIS — Z950 Presence of cardiac pacemaker: Secondary | ICD-10-CM | POA: Diagnosis not present

## 2021-12-06 DIAGNOSIS — I4891 Unspecified atrial fibrillation: Secondary | ICD-10-CM | POA: Diagnosis not present

## 2021-12-06 DIAGNOSIS — Z6836 Body mass index (BMI) 36.0-36.9, adult: Secondary | ICD-10-CM | POA: Diagnosis not present

## 2021-12-06 DIAGNOSIS — R0600 Dyspnea, unspecified: Secondary | ICD-10-CM | POA: Diagnosis not present

## 2021-12-06 DIAGNOSIS — Z45018 Encounter for adjustment and management of other part of cardiac pacemaker: Secondary | ICD-10-CM | POA: Diagnosis not present

## 2021-12-10 DIAGNOSIS — Z45018 Encounter for adjustment and management of other part of cardiac pacemaker: Secondary | ICD-10-CM | POA: Diagnosis not present

## 2021-12-10 DIAGNOSIS — I4891 Unspecified atrial fibrillation: Secondary | ICD-10-CM | POA: Diagnosis not present

## 2021-12-30 DIAGNOSIS — Z45018 Encounter for adjustment and management of other part of cardiac pacemaker: Secondary | ICD-10-CM | POA: Diagnosis not present

## 2021-12-31 DIAGNOSIS — R0600 Dyspnea, unspecified: Secondary | ICD-10-CM | POA: Diagnosis not present

## 2021-12-31 DIAGNOSIS — I2699 Other pulmonary embolism without acute cor pulmonale: Secondary | ICD-10-CM | POA: Diagnosis not present

## 2021-12-31 DIAGNOSIS — K449 Diaphragmatic hernia without obstruction or gangrene: Secondary | ICD-10-CM | POA: Diagnosis not present

## 2021-12-31 DIAGNOSIS — Z950 Presence of cardiac pacemaker: Secondary | ICD-10-CM | POA: Diagnosis not present

## 2022-02-01 DIAGNOSIS — S139XXA Sprain of joints and ligaments of unspecified parts of neck, initial encounter: Secondary | ICD-10-CM | POA: Diagnosis not present

## 2022-02-01 DIAGNOSIS — L03211 Cellulitis of face: Secondary | ICD-10-CM | POA: Diagnosis not present

## 2022-03-07 DIAGNOSIS — Z6837 Body mass index (BMI) 37.0-37.9, adult: Secondary | ICD-10-CM | POA: Diagnosis not present

## 2022-03-07 DIAGNOSIS — I480 Paroxysmal atrial fibrillation: Secondary | ICD-10-CM | POA: Diagnosis not present

## 2022-03-07 DIAGNOSIS — I4819 Other persistent atrial fibrillation: Secondary | ICD-10-CM | POA: Diagnosis not present

## 2022-03-07 DIAGNOSIS — I5030 Unspecified diastolic (congestive) heart failure: Secondary | ICD-10-CM | POA: Diagnosis not present

## 2022-03-07 DIAGNOSIS — I471 Supraventricular tachycardia: Secondary | ICD-10-CM | POA: Diagnosis not present

## 2022-03-11 DIAGNOSIS — I4819 Other persistent atrial fibrillation: Secondary | ICD-10-CM | POA: Diagnosis not present

## 2022-03-11 DIAGNOSIS — I471 Supraventricular tachycardia: Secondary | ICD-10-CM | POA: Diagnosis not present

## 2022-03-31 DIAGNOSIS — Z45018 Encounter for adjustment and management of other part of cardiac pacemaker: Secondary | ICD-10-CM | POA: Diagnosis not present

## 2022-04-07 ENCOUNTER — Other Ambulatory Visit (HOSPITAL_BASED_OUTPATIENT_CLINIC_OR_DEPARTMENT_OTHER): Payer: Self-pay

## 2022-04-13 DIAGNOSIS — E079 Disorder of thyroid, unspecified: Secondary | ICD-10-CM | POA: Diagnosis not present

## 2022-04-13 DIAGNOSIS — Z7901 Long term (current) use of anticoagulants: Secondary | ICD-10-CM | POA: Diagnosis not present

## 2022-04-13 DIAGNOSIS — Z79899 Other long term (current) drug therapy: Secondary | ICD-10-CM | POA: Diagnosis not present

## 2022-04-13 DIAGNOSIS — I2699 Other pulmonary embolism without acute cor pulmonale: Secondary | ICD-10-CM | POA: Diagnosis not present

## 2022-04-13 DIAGNOSIS — I82A12 Acute embolism and thrombosis of left axillary vein: Secondary | ICD-10-CM | POA: Diagnosis not present

## 2022-04-13 DIAGNOSIS — Z86718 Personal history of other venous thrombosis and embolism: Secondary | ICD-10-CM | POA: Diagnosis not present

## 2022-04-13 DIAGNOSIS — F419 Anxiety disorder, unspecified: Secondary | ICD-10-CM | POA: Insufficient documentation

## 2022-04-13 DIAGNOSIS — I82401 Acute embolism and thrombosis of unspecified deep veins of right lower extremity: Secondary | ICD-10-CM | POA: Diagnosis not present

## 2022-04-13 DIAGNOSIS — Z6836 Body mass index (BMI) 36.0-36.9, adult: Secondary | ICD-10-CM | POA: Diagnosis not present

## 2022-04-18 DIAGNOSIS — F32A Depression, unspecified: Secondary | ICD-10-CM | POA: Diagnosis not present

## 2022-04-18 DIAGNOSIS — I10 Essential (primary) hypertension: Secondary | ICD-10-CM | POA: Diagnosis not present

## 2022-04-18 DIAGNOSIS — I110 Hypertensive heart disease with heart failure: Secondary | ICD-10-CM | POA: Diagnosis not present

## 2022-04-18 DIAGNOSIS — Z6836 Body mass index (BMI) 36.0-36.9, adult: Secondary | ICD-10-CM | POA: Diagnosis not present

## 2022-04-18 DIAGNOSIS — I5030 Unspecified diastolic (congestive) heart failure: Secondary | ICD-10-CM | POA: Diagnosis not present

## 2022-04-18 DIAGNOSIS — I480 Paroxysmal atrial fibrillation: Secondary | ICD-10-CM | POA: Diagnosis not present

## 2022-04-18 DIAGNOSIS — I4891 Unspecified atrial fibrillation: Secondary | ICD-10-CM | POA: Diagnosis not present

## 2022-04-18 DIAGNOSIS — I471 Supraventricular tachycardia: Secondary | ICD-10-CM | POA: Diagnosis not present

## 2022-04-25 DIAGNOSIS — I5030 Unspecified diastolic (congestive) heart failure: Secondary | ICD-10-CM | POA: Diagnosis not present

## 2022-05-20 ENCOUNTER — Other Ambulatory Visit: Payer: Self-pay | Admitting: Internal Medicine

## 2022-05-20 DIAGNOSIS — Z1231 Encounter for screening mammogram for malignant neoplasm of breast: Secondary | ICD-10-CM

## 2022-06-06 ENCOUNTER — Ambulatory Visit
Admission: RE | Admit: 2022-06-06 | Discharge: 2022-06-06 | Disposition: A | Discharge status: Home / Self Care | Payer: BC Managed Care – PPO | Source: Ambulatory Visit | Attending: Internal Medicine | Admitting: Internal Medicine

## 2022-06-06 DIAGNOSIS — Z1231 Encounter for screening mammogram for malignant neoplasm of breast: Secondary | ICD-10-CM

## 2022-06-13 DIAGNOSIS — R9431 Abnormal electrocardiogram [ECG] [EKG]: Secondary | ICD-10-CM | POA: Diagnosis not present

## 2022-06-13 DIAGNOSIS — I319 Disease of pericardium, unspecified: Secondary | ICD-10-CM | POA: Diagnosis not present

## 2022-06-13 DIAGNOSIS — I4819 Other persistent atrial fibrillation: Secondary | ICD-10-CM | POA: Diagnosis not present

## 2022-06-13 DIAGNOSIS — E079 Disorder of thyroid, unspecified: Secondary | ICD-10-CM | POA: Diagnosis not present

## 2022-06-13 DIAGNOSIS — Z950 Presence of cardiac pacemaker: Secondary | ICD-10-CM | POA: Diagnosis not present

## 2022-06-13 DIAGNOSIS — I471 Supraventricular tachycardia: Secondary | ICD-10-CM | POA: Diagnosis not present

## 2022-06-13 DIAGNOSIS — Z7901 Long term (current) use of anticoagulants: Secondary | ICD-10-CM | POA: Diagnosis not present

## 2022-06-13 DIAGNOSIS — I495 Sick sinus syndrome: Secondary | ICD-10-CM | POA: Diagnosis not present

## 2022-06-13 DIAGNOSIS — Z79899 Other long term (current) drug therapy: Secondary | ICD-10-CM | POA: Diagnosis not present

## 2022-06-13 DIAGNOSIS — I5030 Unspecified diastolic (congestive) heart failure: Secondary | ICD-10-CM | POA: Diagnosis not present

## 2022-06-13 DIAGNOSIS — I442 Atrioventricular block, complete: Secondary | ICD-10-CM | POA: Diagnosis not present

## 2022-06-13 DIAGNOSIS — Z86718 Personal history of other venous thrombosis and embolism: Secondary | ICD-10-CM | POA: Diagnosis not present

## 2022-06-13 DIAGNOSIS — I4892 Unspecified atrial flutter: Secondary | ICD-10-CM | POA: Diagnosis not present

## 2022-06-13 DIAGNOSIS — Z6836 Body mass index (BMI) 36.0-36.9, adult: Secondary | ICD-10-CM | POA: Diagnosis not present

## 2022-06-14 DIAGNOSIS — I480 Paroxysmal atrial fibrillation: Secondary | ICD-10-CM | POA: Diagnosis not present

## 2022-06-14 DIAGNOSIS — I471 Supraventricular tachycardia: Secondary | ICD-10-CM | POA: Diagnosis not present

## 2022-06-14 DIAGNOSIS — I5030 Unspecified diastolic (congestive) heart failure: Secondary | ICD-10-CM | POA: Diagnosis not present

## 2022-06-23 DIAGNOSIS — I471 Supraventricular tachycardia: Secondary | ICD-10-CM | POA: Diagnosis not present

## 2022-06-23 DIAGNOSIS — I442 Atrioventricular block, complete: Secondary | ICD-10-CM | POA: Diagnosis not present

## 2022-06-30 DIAGNOSIS — Z45018 Encounter for adjustment and management of other part of cardiac pacemaker: Secondary | ICD-10-CM | POA: Diagnosis not present

## 2022-07-25 DIAGNOSIS — Z6835 Body mass index (BMI) 35.0-35.9, adult: Secondary | ICD-10-CM | POA: Diagnosis not present

## 2022-07-25 DIAGNOSIS — Z45018 Encounter for adjustment and management of other part of cardiac pacemaker: Secondary | ICD-10-CM | POA: Diagnosis not present

## 2022-07-25 DIAGNOSIS — I5030 Unspecified diastolic (congestive) heart failure: Secondary | ICD-10-CM | POA: Diagnosis not present

## 2022-08-05 DIAGNOSIS — Z45018 Encounter for adjustment and management of other part of cardiac pacemaker: Secondary | ICD-10-CM | POA: Diagnosis not present

## 2022-08-19 DIAGNOSIS — Z45018 Encounter for adjustment and management of other part of cardiac pacemaker: Secondary | ICD-10-CM | POA: Diagnosis not present

## 2022-08-22 DIAGNOSIS — I495 Sick sinus syndrome: Secondary | ICD-10-CM | POA: Diagnosis not present

## 2022-08-22 DIAGNOSIS — Z86718 Personal history of other venous thrombosis and embolism: Secondary | ICD-10-CM | POA: Diagnosis not present

## 2022-08-22 DIAGNOSIS — Z950 Presence of cardiac pacemaker: Secondary | ICD-10-CM | POA: Diagnosis not present

## 2022-08-22 DIAGNOSIS — Z8679 Personal history of other diseases of the circulatory system: Secondary | ICD-10-CM | POA: Diagnosis not present

## 2022-09-06 DIAGNOSIS — I5030 Unspecified diastolic (congestive) heart failure: Secondary | ICD-10-CM | POA: Diagnosis not present

## 2022-09-06 DIAGNOSIS — Z950 Presence of cardiac pacemaker: Secondary | ICD-10-CM | POA: Diagnosis not present

## 2022-09-06 DIAGNOSIS — I442 Atrioventricular block, complete: Secondary | ICD-10-CM | POA: Diagnosis not present

## 2022-09-06 DIAGNOSIS — I480 Paroxysmal atrial fibrillation: Secondary | ICD-10-CM | POA: Diagnosis not present

## 2022-09-28 DIAGNOSIS — J189 Pneumonia, unspecified organism: Secondary | ICD-10-CM | POA: Insufficient documentation

## 2022-09-30 DIAGNOSIS — Z45018 Encounter for adjustment and management of other part of cardiac pacemaker: Secondary | ICD-10-CM | POA: Diagnosis not present

## 2022-09-30 DIAGNOSIS — Z950 Presence of cardiac pacemaker: Secondary | ICD-10-CM | POA: Diagnosis not present

## 2023-05-09 ENCOUNTER — Other Ambulatory Visit: Payer: Self-pay | Admitting: Internal Medicine

## 2023-05-09 DIAGNOSIS — Z0000 Encounter for general adult medical examination without abnormal findings: Secondary | ICD-10-CM

## 2023-06-08 ENCOUNTER — Ambulatory Visit
Admission: RE | Admit: 2023-06-08 | Discharge: 2023-06-08 | Disposition: A | Discharge status: Home / Self Care | Payer: No Typology Code available for payment source | Source: Ambulatory Visit | Attending: Internal Medicine | Admitting: Internal Medicine

## 2023-06-08 DIAGNOSIS — Z0000 Encounter for general adult medical examination without abnormal findings: Secondary | ICD-10-CM

## 2023-12-28 DIAGNOSIS — Q248 Other specified congenital malformations of heart: Secondary | ICD-10-CM | POA: Insufficient documentation

## 2024-04-22 ENCOUNTER — Ambulatory Visit: Admission: EM | Admit: 2024-04-22 | Discharge: 2024-04-22 | Disposition: A | Discharge status: Home / Self Care

## 2024-04-22 DIAGNOSIS — R21 Rash and other nonspecific skin eruption: Secondary | ICD-10-CM

## 2024-04-22 DIAGNOSIS — W57XXXA Bitten or stung by nonvenomous insect and other nonvenomous arthropods, initial encounter: Secondary | ICD-10-CM | POA: Diagnosis not present

## 2024-04-22 DIAGNOSIS — S70361A Insect bite (nonvenomous), right thigh, initial encounter: Secondary | ICD-10-CM | POA: Diagnosis not present

## 2024-04-22 DIAGNOSIS — I82401 Acute embolism and thrombosis of unspecified deep veins of right lower extremity: Secondary | ICD-10-CM | POA: Insufficient documentation

## 2024-04-22 MED ORDER — DOXYCYCLINE HYCLATE 100 MG PO CAPS: 100.0000 mg | ORAL_CAPSULE | Freq: Two times a day (BID) | ORAL | 0 refills | Status: AC

## 2024-04-22 NOTE — ED Triage Notes (Signed)
 I have this rash/swelling under my right arm/pit. Over the last few wks I have had random swelling/pain in various areas. Recently I have gotten a tick bite in my right upper inner thigh. Fever up to 102 this am, chills.

## 2024-04-22 NOTE — ED Provider Notes (Signed)
 [facility]    CSN: [id number] Arrival date & time: 04/22/24  1938      History   Chief Complaint Chief Complaint  Patient presents with   Skin Problem   Insect Bite    HPI [name] is a 48 y.o. female.   Patient here today for evaluation of erythema and rash to left upper arm.  She reports symptoms have been ongoing and waxing and waning over the last several days.  She notes that she did have a tick bite to her right inner thigh prior to developing this.  She notes she did develop a rash that looked more like a bull's-eye to that area but this resolved.  She notes at times she will have fever with Tmax 102 today.  Symptoms again are waxing and waning and she is not sure if this is related to her tick bite.  She does not report any shortness of breath or trouble breathing.  The history is provided by the patient.    Past Medical History:  Diagnosis Date   Anxiety    CHF (congestive heart failure) (HCC)    takes Lasix  to keep fluid off heart   Depression    Headache    migraines - thinks from stress of Breast cancer diagnosis   HSV-2 (herpes simplex virus 2) infection    Hypothyroid    Pacemaker    Spontaneous abortion    x3   SVT (supraventricular tachycardia) (HCC)    3 DEGREE HEART BLOCK/ATRIAL FI    Patient Active Problem List   Diagnosis Date Noted   Right leg DVT (HCC) 04/22/2024   Pericardial cyst 12/28/2023   Pneumonia 09/28/2022   Anxiety 04/13/2022   Deep vein thrombosis (DVT) of axillary vein of left upper extremity (HCC) 04/13/2022   Atrial fibrillation (HCC) 08/20/2021   Cardiac pacemaker syndrome 06/09/2017   Right pulmonary embolus (HCC) 10/31/2016   Complete heart block (HCC) 04/05/2013   Atrial tachycardia (HCC) 04/05/2013   SVT (supraventricular tachycardia) (HCC) 04/05/2012   Atrial flutter (HCC) 04/05/2012   Pacemaker 04/05/2012   Pericarditis 04/05/2012   DUB (dysfunctional uterine bleeding) 04/05/2012     Past Surgical History:  Procedure Laterality Date   ABDOMINAL HYSTERECTOMY  SEPT 2013   [facility]   BREAST EXCISIONAL BIOPSY Right 06/02/2016   Fibroadenoma   BREAST LUMPECTOMY WITH RADIOACTIVE SEED LOCALIZATION Right 06/02/2016   Procedure: RIGHT BREAST LUMPECTOMY WITH RADIOACTIVE SEED LOCALIZATION;  Surgeon: Debby Shipper, MD;  Location: [facility];  Service: General;  Laterality: Right;   BREAST SURGERY  05/2000   REDUCTION MAMMAPLASTY   CARDIAC ELECTROPHYSIOLOGY MAPPING AND ABLATION     done several times at [facility]  April 22 2011,    CESAREAN SECTION  2005   WITH BTL   CHOLECYSTECTOMY  11/2009   PACEMAKER INSERTION     NEW ONE INSERTED 11/10   REDUCTION MAMMAPLASTY Bilateral    TUBAL LIGATION  2005   AT C/S     OB History     Gravida  5   Para  2   Term      Preterm  2   AB  3   Living  2      SAB  3   IAB      Ectopic      Multiple      Live Births  2            Home Medications  Prior to Admission medications   Medication Sig Start Date End Date Taking? Authorizing Provider  doxycycline (VIBRAMYCIN) 100 MG capsule Take 1 capsule (100 mg total) by mouth 2 (two) times daily for 7 days. 04/22/24 04/29/24 Yes Billy Asberry FALCON, PA-C  ENTRESTO 24-26 MG Take 1 tablet by mouth 2 (two) times daily. 02/01/24  Yes [provider]  furosemide  (LASIX ) 40 MG tablet Take 40 mg by mouth daily.    Yes [provider]  gabapentin  (NEURONTIN ) 300 MG capsule Take 300 mg by mouth 3 (three) times daily. 02/08/24  Yes [provider]  HYDROcodone -acetaminophen  (NORCO/VICODIN) 5-325 MG tablet Take 1-2 tablets by mouth every 8 (eight) hours. 06/09/17  Yes [provider]  HYDROmorphone  (DILAUDID ) 2 MG tablet Take 2 mg by mouth as directed. 01/23/24  Yes [provider]  JARDIANCE 10 MG TABS tablet Take 10 mg by mouth daily. 02/01/24  Yes [provider]  levothyroxine  (SYNTHROID , LEVOTHROID) 50  MCG tablet Take 50 mcg by mouth daily.   Yes [provider]  methocarbamol (ROBAXIN) 500 MG tablet Take 500 mg by mouth as directed. 01/23/24  Yes [provider]  Rivaroxaban  15 & 20 MG TBPK Take 15 mg by mouth 2 (two) times daily. Take as directed on package: Start with one 15mg  tablet by mouth twice a day with food. On Day 22, switch to one 20mg  tablet once a day with food. 06/03/16  Yes Levora, Tiffany, PA-C  rosuvastatin (CRESTOR) 20 MG tablet Take 20 mg by mouth daily.   Yes [provider]  Sacubitril-Valsartan (ENTRESTO PO) 2 (two) times daily.   Yes [provider]  spironolactone (ALDACTONE) 25 MG tablet Take 25 mg by mouth daily.   Yes [provider]  tamsulosin (FLOMAX) 0.4 MG CAPS capsule Take 0.4 mg by mouth. 01/23/24  Yes [provider]  ALPRAZolam  (XANAX ) 0.5 MG tablet Take 1 tablet (0.5 mg total) by mouth at bedtime as needed for anxiety. 10/15/13   Winfred Curlee DEL, MD  benzonatate (TESSALON) 100 MG capsule Take 100 mg by mouth 3 (three) times daily as needed.    [provider]  Cholecalciferol (VITAMIN D PO) Take by mouth daily.    [provider]  Ferrous Sulfate (IRON PO) Take by mouth.    [provider]  fluticasone (FLONASE) 50 MCG/ACT nasal spray Place 1 spray into both nostrils daily.    [provider]  Multiple Vitamin (MULTIVITAMIN) tablet Take 1 tablet by mouth daily.    [provider]  naproxen  (NAPROSYN ) 500 MG tablet Take 1 tablet (500 mg total) by mouth 2 (two) times daily with a meal. 06/02/16   Cornett, Debby, MD  Ondansetron  HCl (ZOFRAN  PO) Take by mouth as needed.    [provider]  predniSONE (DELTASONE) 10 MG tablet Take 10 mg by mouth as directed.    [provider]  promethazine -dextromethorphan (PROMETHAZINE -DM) 6.25-15 MG/5ML syrup Take by mouth 4 (four) times daily as needed.    [provider]  rosuvastatin (CRESTOR) 10 MG  tablet Take 10 mg by mouth daily.    [provider]  Sertraline HCl (ZOLOFT PO) Take by mouth every morning.    [provider]  tobramycin-dexamethasone  ANTHONEY) ophthalmic solution Place 1 drop into both eyes.    [provider]  Topiramate (TOPAMAX PO) Take by mouth.    [provider]    Family History Family History  Problem Relation Age of Onset   Heart disease  Father    Cancer Brother        SKIN   Cancer Maternal Grandmother        OVARIAN   Breast cancer Paternal Grandmother     Social History Social History   Tobacco Use   Smoking status: Never   Smokeless tobacco: Never  Vaping Use   Vaping status: Never Used  Substance Use Topics   Alcohol use: Not Currently    Comment: HOLIDAYS   Drug use: No     Allergies   Oxycodone-acetaminophen , Wound dressing adhesive, Other, and Percocet [oxycodone-acetaminophen ]   Review of Systems Review of Systems  Constitutional:  Negative for chills and fever.  HENT:  Negative for facial swelling and trouble swallowing.   Eyes:  Negative for discharge and redness.  Respiratory:  Negative for shortness of breath.   Gastrointestinal:  Negative for abdominal pain, nausea and vomiting.  Skin:  Positive for color change and rash.     Physical Exam Triage Vital Signs ED Triage Vitals  Encounter Vitals Group     BP      Girls Systolic BP Percentile      Girls Diastolic BP Percentile      Boys Systolic BP Percentile      Boys Diastolic BP Percentile      Pulse      Resp      Temp      Temp src      SpO2      Weight      Height      Head Circumference      Peak Flow      Pain Score      Pain Loc      Pain Education      Exclude from Growth Chart    No data found.  Updated Vital Signs BP 114/76 (BP Location: Right Arm)   Pulse 64   Temp 98.9 F (37.2 C) (Oral)   Resp 18   Ht 5' 4 (1.626 m)   Wt 203 lb (92.1 kg)   LMP 04/13/2012   SpO2 97%   BMI 34.84 kg/m    Visual Acuity Right Eye Distance:   Left Eye Distance:   Bilateral Distance:    Right Eye Near:   Left Eye Near:    Bilateral Near:     Physical Exam Vitals and nursing note reviewed.  Constitutional:      General: She is not in acute distress.    Appearance: Normal appearance. She is not ill-appearing.  HENT:     Head: Normocephalic and atraumatic.   Eyes:     Conjunctiva/sclera: Conjunctivae normal.    Cardiovascular:     Rate and Rhythm: Normal rate.  Pulmonary:     Effort: Pulmonary effort is normal. No respiratory distress.   Skin:    Comments: See photo- left upper arm   Neurological:     Mental Status: She is alert.   Psychiatric:        Mood and Affect: Mood normal.        Behavior: Behavior normal.        Thought Content: Thought content normal.      UC Treatments / Results  Labs (all labs ordered are listed, but only abnormal results are displayed) Labs Reviewed - No data to display  EKG   Radiology No results found.  Procedures Procedures (including critical care time)  Medications Ordered in UC Medications - No data to display  Initial Impression /  Assessment and Plan / UC Course  I have reviewed the triage vital signs and the nursing notes.  Pertinent labs & imaging results that were available during my care of the patient were reviewed by me and considered in my medical decision making (see chart for details).    Will treat with doxycycline given known tick bite.  Discussed continued use of Benadryl  if needed for itching and advised to follow-up should redness continue to spread.  Area marked with marker in office for continued monitoring.  Encouraged sooner follow-up with any concerns.  Final Clinical Impressions(s) / UC Diagnoses   Final diagnoses:  Tick bite of right thigh, initial encounter  Rash   Discharge Instructions   None    ED Prescriptions     Medication Sig Dispense Auth. Provider   doxycycline (VIBRAMYCIN)  100 MG capsule Take 1 capsule (100 mg total) by mouth 2 (two) times daily for 7 days. 14 capsule Billy Asberry FALCON, PA-C      PDMP not reviewed this encounter.   Billy Asberry FALCON, PA-C 04/22/24 1958

## 2024-04-22 NOTE — Discharge Instructions (Signed)
Please follow up if no gradual improvement or with any further concerns.

## 2024-06-11 ENCOUNTER — Other Ambulatory Visit: Payer: Self-pay | Admitting: Internal Medicine

## 2024-06-11 DIAGNOSIS — Z1231 Encounter for screening mammogram for malignant neoplasm of breast: Secondary | ICD-10-CM

## 2024-06-27 ENCOUNTER — Ambulatory Visit
Admission: RE | Admit: 2024-06-27 | Discharge: 2024-06-27 | Disposition: A | Discharge status: Home / Self Care | Source: Ambulatory Visit | Attending: Internal Medicine | Admitting: Internal Medicine

## 2024-06-27 DIAGNOSIS — Z1231 Encounter for screening mammogram for malignant neoplasm of breast: Secondary | ICD-10-CM
